# Patient Record
Sex: Female | Born: 1955 | Race: Black or African American | Hispanic: No | Marital: Married | Smoking: Never smoker
Health system: Southern US, Community
[De-identification: ages and names within clinical notes are randomized; demographics above are authoritative.]

## PROBLEM LIST (undated history)

## (undated) DIAGNOSIS — I10 Essential (primary) hypertension: Secondary | ICD-10-CM

## (undated) DIAGNOSIS — M1990 Unspecified osteoarthritis, unspecified site: Secondary | ICD-10-CM

## (undated) DIAGNOSIS — N189 Chronic kidney disease, unspecified: Secondary | ICD-10-CM

## (undated) HISTORY — DX: Unspecified osteoarthritis, unspecified site: M19.90

## (undated) HISTORY — PX: CHOLECYSTECTOMY: SHX55

## (undated) HISTORY — DX: Essential (primary) hypertension: I10

## (undated) HISTORY — PX: TUBAL LIGATION: SHX77

---

## 2015-07-07 ENCOUNTER — Encounter (HOSPITAL_BASED_OUTPATIENT_CLINIC_OR_DEPARTMENT_OTHER): Payer: BLUE CROSS/BLUE SHIELD | Attending: Surgery

## 2015-07-07 DIAGNOSIS — I739 Peripheral vascular disease, unspecified: Secondary | ICD-10-CM | POA: Insufficient documentation

## 2015-07-07 DIAGNOSIS — I10 Essential (primary) hypertension: Secondary | ICD-10-CM | POA: Diagnosis not present

## 2015-07-07 DIAGNOSIS — Z87891 Personal history of nicotine dependence: Secondary | ICD-10-CM | POA: Insufficient documentation

## 2015-07-07 DIAGNOSIS — I87332 Chronic venous hypertension (idiopathic) with ulcer and inflammation of left lower extremity: Secondary | ICD-10-CM | POA: Diagnosis not present

## 2015-07-07 DIAGNOSIS — L97821 Non-pressure chronic ulcer of other part of left lower leg limited to breakdown of skin: Secondary | ICD-10-CM | POA: Diagnosis present

## 2015-07-14 DIAGNOSIS — I87332 Chronic venous hypertension (idiopathic) with ulcer and inflammation of left lower extremity: Secondary | ICD-10-CM | POA: Diagnosis not present

## 2015-07-21 DIAGNOSIS — I87332 Chronic venous hypertension (idiopathic) with ulcer and inflammation of left lower extremity: Secondary | ICD-10-CM | POA: Diagnosis not present

## 2015-07-28 ENCOUNTER — Encounter (HOSPITAL_BASED_OUTPATIENT_CLINIC_OR_DEPARTMENT_OTHER): Payer: BLUE CROSS/BLUE SHIELD | Attending: Surgery

## 2015-07-28 DIAGNOSIS — I739 Peripheral vascular disease, unspecified: Secondary | ICD-10-CM | POA: Diagnosis not present

## 2015-07-28 DIAGNOSIS — I87332 Chronic venous hypertension (idiopathic) with ulcer and inflammation of left lower extremity: Secondary | ICD-10-CM | POA: Insufficient documentation

## 2015-07-28 DIAGNOSIS — L97821 Non-pressure chronic ulcer of other part of left lower leg limited to breakdown of skin: Secondary | ICD-10-CM | POA: Insufficient documentation

## 2015-08-04 ENCOUNTER — Encounter (HOSPITAL_BASED_OUTPATIENT_CLINIC_OR_DEPARTMENT_OTHER): Payer: BLUE CROSS/BLUE SHIELD

## 2015-08-04 DIAGNOSIS — I87332 Chronic venous hypertension (idiopathic) with ulcer and inflammation of left lower extremity: Secondary | ICD-10-CM | POA: Diagnosis not present

## 2015-08-11 DIAGNOSIS — I87332 Chronic venous hypertension (idiopathic) with ulcer and inflammation of left lower extremity: Secondary | ICD-10-CM | POA: Diagnosis not present

## 2015-08-18 DIAGNOSIS — I87332 Chronic venous hypertension (idiopathic) with ulcer and inflammation of left lower extremity: Secondary | ICD-10-CM | POA: Diagnosis not present

## 2015-08-25 ENCOUNTER — Encounter (HOSPITAL_BASED_OUTPATIENT_CLINIC_OR_DEPARTMENT_OTHER): Payer: BLUE CROSS/BLUE SHIELD | Attending: Surgery

## 2015-08-25 DIAGNOSIS — L97821 Non-pressure chronic ulcer of other part of left lower leg limited to breakdown of skin: Secondary | ICD-10-CM | POA: Insufficient documentation

## 2015-08-25 DIAGNOSIS — I87332 Chronic venous hypertension (idiopathic) with ulcer and inflammation of left lower extremity: Secondary | ICD-10-CM | POA: Diagnosis not present

## 2015-08-25 DIAGNOSIS — I10 Essential (primary) hypertension: Secondary | ICD-10-CM | POA: Insufficient documentation

## 2015-09-01 DIAGNOSIS — L97821 Non-pressure chronic ulcer of other part of left lower leg limited to breakdown of skin: Secondary | ICD-10-CM | POA: Diagnosis not present

## 2015-09-08 DIAGNOSIS — L97821 Non-pressure chronic ulcer of other part of left lower leg limited to breakdown of skin: Secondary | ICD-10-CM | POA: Diagnosis not present

## 2015-09-13 DIAGNOSIS — L97821 Non-pressure chronic ulcer of other part of left lower leg limited to breakdown of skin: Secondary | ICD-10-CM | POA: Diagnosis not present

## 2015-09-15 DIAGNOSIS — L97821 Non-pressure chronic ulcer of other part of left lower leg limited to breakdown of skin: Secondary | ICD-10-CM | POA: Diagnosis not present

## 2015-09-22 DIAGNOSIS — L97821 Non-pressure chronic ulcer of other part of left lower leg limited to breakdown of skin: Secondary | ICD-10-CM | POA: Diagnosis not present

## 2015-09-29 ENCOUNTER — Encounter (HOSPITAL_BASED_OUTPATIENT_CLINIC_OR_DEPARTMENT_OTHER): Payer: BLUE CROSS/BLUE SHIELD | Attending: Surgery

## 2015-09-29 DIAGNOSIS — I739 Peripheral vascular disease, unspecified: Secondary | ICD-10-CM | POA: Diagnosis not present

## 2015-09-29 DIAGNOSIS — I10 Essential (primary) hypertension: Secondary | ICD-10-CM | POA: Insufficient documentation

## 2015-09-29 DIAGNOSIS — L97821 Non-pressure chronic ulcer of other part of left lower leg limited to breakdown of skin: Secondary | ICD-10-CM | POA: Insufficient documentation

## 2015-09-29 DIAGNOSIS — Z87891 Personal history of nicotine dependence: Secondary | ICD-10-CM | POA: Diagnosis not present

## 2015-09-29 DIAGNOSIS — I87332 Chronic venous hypertension (idiopathic) with ulcer and inflammation of left lower extremity: Secondary | ICD-10-CM | POA: Insufficient documentation

## 2015-10-05 DIAGNOSIS — I87332 Chronic venous hypertension (idiopathic) with ulcer and inflammation of left lower extremity: Secondary | ICD-10-CM | POA: Diagnosis not present

## 2015-10-12 DIAGNOSIS — I87332 Chronic venous hypertension (idiopathic) with ulcer and inflammation of left lower extremity: Secondary | ICD-10-CM | POA: Diagnosis not present

## 2015-10-19 DIAGNOSIS — I87332 Chronic venous hypertension (idiopathic) with ulcer and inflammation of left lower extremity: Secondary | ICD-10-CM | POA: Diagnosis not present

## 2015-10-26 ENCOUNTER — Encounter (HOSPITAL_BASED_OUTPATIENT_CLINIC_OR_DEPARTMENT_OTHER): Payer: BLUE CROSS/BLUE SHIELD | Attending: Surgery

## 2015-10-26 DIAGNOSIS — L97821 Non-pressure chronic ulcer of other part of left lower leg limited to breakdown of skin: Secondary | ICD-10-CM | POA: Diagnosis not present

## 2015-10-26 DIAGNOSIS — Z872 Personal history of diseases of the skin and subcutaneous tissue: Secondary | ICD-10-CM | POA: Insufficient documentation

## 2015-10-26 DIAGNOSIS — I10 Essential (primary) hypertension: Secondary | ICD-10-CM | POA: Diagnosis not present

## 2015-11-02 DIAGNOSIS — L97821 Non-pressure chronic ulcer of other part of left lower leg limited to breakdown of skin: Secondary | ICD-10-CM | POA: Diagnosis not present

## 2015-12-22 ENCOUNTER — Encounter (HOSPITAL_BASED_OUTPATIENT_CLINIC_OR_DEPARTMENT_OTHER): Payer: BLUE CROSS/BLUE SHIELD | Attending: Surgery

## 2015-12-22 DIAGNOSIS — I890 Lymphedema, not elsewhere classified: Secondary | ICD-10-CM | POA: Insufficient documentation

## 2015-12-22 DIAGNOSIS — L97821 Non-pressure chronic ulcer of other part of left lower leg limited to breakdown of skin: Secondary | ICD-10-CM | POA: Insufficient documentation

## 2015-12-22 DIAGNOSIS — M1990 Unspecified osteoarthritis, unspecified site: Secondary | ICD-10-CM | POA: Diagnosis not present

## 2015-12-22 DIAGNOSIS — I10 Essential (primary) hypertension: Secondary | ICD-10-CM | POA: Insufficient documentation

## 2015-12-22 DIAGNOSIS — I87332 Chronic venous hypertension (idiopathic) with ulcer and inflammation of left lower extremity: Secondary | ICD-10-CM | POA: Insufficient documentation

## 2015-12-29 ENCOUNTER — Encounter (HOSPITAL_BASED_OUTPATIENT_CLINIC_OR_DEPARTMENT_OTHER): Payer: BLUE CROSS/BLUE SHIELD | Attending: Surgery

## 2015-12-29 DIAGNOSIS — I890 Lymphedema, not elsewhere classified: Secondary | ICD-10-CM | POA: Diagnosis not present

## 2015-12-29 DIAGNOSIS — Z87891 Personal history of nicotine dependence: Secondary | ICD-10-CM | POA: Diagnosis not present

## 2015-12-29 DIAGNOSIS — I10 Essential (primary) hypertension: Secondary | ICD-10-CM | POA: Insufficient documentation

## 2015-12-29 DIAGNOSIS — I87332 Chronic venous hypertension (idiopathic) with ulcer and inflammation of left lower extremity: Secondary | ICD-10-CM | POA: Insufficient documentation

## 2015-12-29 DIAGNOSIS — L97821 Non-pressure chronic ulcer of other part of left lower leg limited to breakdown of skin: Secondary | ICD-10-CM | POA: Insufficient documentation

## 2016-01-04 ENCOUNTER — Other Ambulatory Visit: Payer: Self-pay | Admitting: Surgery

## 2016-01-04 ENCOUNTER — Ambulatory Visit (HOSPITAL_COMMUNITY)
Admission: RE | Admit: 2016-01-04 | Discharge: 2016-01-04 | Disposition: A | Discharge status: Home / Self Care | Payer: BLUE CROSS/BLUE SHIELD | Source: Ambulatory Visit | Attending: Vascular Surgery | Admitting: Vascular Surgery

## 2016-01-04 ENCOUNTER — Encounter (HOSPITAL_BASED_OUTPATIENT_CLINIC_OR_DEPARTMENT_OTHER): Payer: BLUE CROSS/BLUE SHIELD

## 2016-01-04 DIAGNOSIS — I8392 Asymptomatic varicose veins of left lower extremity: Secondary | ICD-10-CM | POA: Insufficient documentation

## 2016-01-04 DIAGNOSIS — R600 Localized edema: Secondary | ICD-10-CM

## 2016-01-04 DIAGNOSIS — I87332 Chronic venous hypertension (idiopathic) with ulcer and inflammation of left lower extremity: Secondary | ICD-10-CM | POA: Diagnosis not present

## 2016-01-04 DIAGNOSIS — L97829 Non-pressure chronic ulcer of other part of left lower leg with unspecified severity: Secondary | ICD-10-CM | POA: Diagnosis not present

## 2016-01-04 DIAGNOSIS — L97929 Non-pressure chronic ulcer of unspecified part of left lower leg with unspecified severity: Secondary | ICD-10-CM

## 2016-01-11 DIAGNOSIS — I87332 Chronic venous hypertension (idiopathic) with ulcer and inflammation of left lower extremity: Secondary | ICD-10-CM | POA: Diagnosis not present

## 2016-01-18 DIAGNOSIS — I87332 Chronic venous hypertension (idiopathic) with ulcer and inflammation of left lower extremity: Secondary | ICD-10-CM | POA: Diagnosis not present

## 2016-01-25 ENCOUNTER — Encounter (HOSPITAL_BASED_OUTPATIENT_CLINIC_OR_DEPARTMENT_OTHER): Payer: BLUE CROSS/BLUE SHIELD | Attending: Surgery

## 2016-01-25 DIAGNOSIS — S81012A Laceration without foreign body, left knee, initial encounter: Secondary | ICD-10-CM | POA: Diagnosis not present

## 2016-01-25 DIAGNOSIS — M1990 Unspecified osteoarthritis, unspecified site: Secondary | ICD-10-CM | POA: Insufficient documentation

## 2016-01-25 DIAGNOSIS — L97821 Non-pressure chronic ulcer of other part of left lower leg limited to breakdown of skin: Secondary | ICD-10-CM | POA: Insufficient documentation

## 2016-01-25 DIAGNOSIS — I10 Essential (primary) hypertension: Secondary | ICD-10-CM | POA: Diagnosis not present

## 2016-01-25 DIAGNOSIS — I87332 Chronic venous hypertension (idiopathic) with ulcer and inflammation of left lower extremity: Secondary | ICD-10-CM | POA: Insufficient documentation

## 2016-01-25 DIAGNOSIS — I890 Lymphedema, not elsewhere classified: Secondary | ICD-10-CM | POA: Insufficient documentation

## 2016-01-25 DIAGNOSIS — W19XXXA Unspecified fall, initial encounter: Secondary | ICD-10-CM | POA: Insufficient documentation

## 2016-02-01 DIAGNOSIS — I87332 Chronic venous hypertension (idiopathic) with ulcer and inflammation of left lower extremity: Secondary | ICD-10-CM | POA: Diagnosis not present

## 2016-02-08 DIAGNOSIS — I87332 Chronic venous hypertension (idiopathic) with ulcer and inflammation of left lower extremity: Secondary | ICD-10-CM | POA: Diagnosis not present

## 2016-02-16 DIAGNOSIS — I87332 Chronic venous hypertension (idiopathic) with ulcer and inflammation of left lower extremity: Secondary | ICD-10-CM | POA: Diagnosis not present

## 2016-02-23 DIAGNOSIS — I87332 Chronic venous hypertension (idiopathic) with ulcer and inflammation of left lower extremity: Secondary | ICD-10-CM | POA: Diagnosis not present

## 2017-04-11 ENCOUNTER — Ambulatory Visit: Payer: BLUE CROSS/BLUE SHIELD | Admitting: Orthopaedic Surgery

## 2017-04-17 ENCOUNTER — Ambulatory Visit: Payer: Medicare Other | Admitting: Orthopaedic Surgery

## 2017-04-25 ENCOUNTER — Ambulatory Visit (INDEPENDENT_AMBULATORY_CARE_PROVIDER_SITE_OTHER): Payer: Medicare Other | Admitting: Orthopaedic Surgery

## 2017-04-25 ENCOUNTER — Encounter: Payer: Self-pay | Admitting: Orthopaedic Surgery

## 2017-04-25 VITALS — BP 172/80 | HR 75 | Temp 97.9°F | Ht 63.0 in | Wt 275.0 lb

## 2017-04-25 DIAGNOSIS — M25561 Pain in right knee: Secondary | ICD-10-CM | POA: Diagnosis not present

## 2017-04-25 DIAGNOSIS — G8929 Other chronic pain: Secondary | ICD-10-CM

## 2017-04-25 MED ORDER — NAPROXEN 500 MG PO TABS: 500.0000 mg | ORAL_TABLET | Freq: Two times a day (BID) | ORAL | 5 refills | Status: DC

## 2017-04-25 NOTE — Progress Notes (Signed)
Subjective:    Patient ID: Stacy [name], female    DOB: [birth year], 61 y.o.   MRN: [id number]  HPI She has long history of right knee pain.  She has been seen in [place] by physician there and has decided to be seen here now. She has popping and swelling of the right knee with some giving way.  She brings CD of X-rays of the right knee done within the past year.  She has no new trauma. She has been taking Naprosyn but ran out of it this past week.  She uses ice and Tylenol at times.  Her knee is getting gradually worse.  I have reviewed the CD of the X-rays and her medical notes from [facility].  Review of Systems  HENT: Negative for congestion.   Respiratory: Negative for cough and shortness of breath.   Cardiovascular: Negative for chest pain and leg swelling.  Endocrine: Positive for cold intolerance.  Musculoskeletal: Positive for arthralgias, back pain, gait problem and joint swelling.  Allergic/Immunologic: Positive for environmental allergies.   Past Medical History:  Diagnosis Date  . Arthritis   . Hypertension     Past Surgical History:  Procedure Laterality Date  . TUBAL LIGATION      No current outpatient prescriptions on file prior to visit.   No current facility-administered medications on file prior to visit.     Social History   Social History  . Marital status: Married    Spouse name: N/A  . Number of children: N/A  . Years of education: N/A   Occupational History  . Not on file.   Social History Main Topics  . Smoking status: Never Smoker  . Smokeless tobacco: Never Used  . Alcohol use Not on file  . Drug use: Unknown  . Sexual activity: Not on file   Other Topics Concern  . Not on file   Social History Narrative  . No narrative on file    Family History  Problem Relation Age of Onset  . COPD Mother   . Arthritis Mother   . Heart attack Mother   . Cancer Father   . Hypertension Father   . Arthritis Sister   . Hypertension  Sister   . Arthritis Brother   . Hypertension Brother     BP (!) 172/80   Pulse 75   Temp 97.9 F (36.6 C)   Ht 5\' 3"  (1.6 m)   Wt 275 lb (124.7 kg)   BMI 48.71 kg/m      Objective:   Physical Exam  Constitutional: She is oriented to person, place, and time. She appears well-developed and well-nourished.  HENT:  Head: Normocephalic and atraumatic.  Eyes: Pupils are equal, round, and reactive to light. Conjunctivae and EOM are normal.  Neck: Normal range of motion. Neck supple.  Cardiovascular: Normal rate, regular rhythm and intact distal pulses.   Pulmonary/Chest: Effort normal.  Abdominal: Soft.  Musculoskeletal: She exhibits tenderness (Right knee pain, ROM 0 to 105, crepitus, medial joint line pain, Limp to the right, NV intact.  Knee is stable.).  Neurological: She is alert and oriented to person, place, and time. She displays normal reflexes. No cranial nerve deficit. She exhibits normal muscle tone. Coordination normal.  Skin: Skin is warm and dry.  Psychiatric: She has a normal mood and affect. Her behavior is normal. Judgment and thought content normal.          Assessment & Plan:   Encounter Diagnosis  Name Primary?  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  She has morbid obesity and that would help to lose some weight.  She does not want to consider any surgery at this time.  PROCEDURE NOTE:  The patient requests injections of the right knee , verbal consent was obtained.  The right knee was prepped appropriately after time out was performed.   Sterile technique was observed and injection of 1 cc of Depo-Medrol 40 mg with several cc's of plain xylocaine. Anesthesia was provided by ethyl chloride and a 20-gauge needle was used to inject the knee area. The injection was tolerated well.  A band aid dressing was  applied.  The patient was advised to apply ice later today and tomorrow to the injection sight as needed.  I will see her back in three weeks.  Rx for Naprosyn called in.  Call if any problem.  Precautions discussed.   Electronically Signed Darreld McleanWayne Senetra Dillin, MD 10/31/20182:04 PM

## 2017-05-02 ENCOUNTER — Encounter: Payer: Self-pay | Admitting: Orthopedic Surgery

## 2017-05-16 ENCOUNTER — Encounter: Payer: Self-pay | Admitting: Orthopaedic Surgery

## 2017-05-16 ENCOUNTER — Ambulatory Visit (INDEPENDENT_AMBULATORY_CARE_PROVIDER_SITE_OTHER): Payer: Medicare Other | Admitting: Orthopaedic Surgery

## 2017-05-16 VITALS — BP 173/91 | HR 67 | Temp 97.8°F | Ht 63.0 in | Wt 278.0 lb

## 2017-05-16 DIAGNOSIS — G8929 Other chronic pain: Secondary | ICD-10-CM

## 2017-05-16 DIAGNOSIS — M25561 Pain in right knee: Secondary | ICD-10-CM | POA: Diagnosis not present

## 2017-05-16 NOTE — Progress Notes (Signed)
Patient ZO:XWRUEA:Stacy [name], female DOB:06-Dec-1955, 61 y.o. VWU:[id number]  Chief Complaint  Patient presents with  . Knee Pain    right and new problem with left hip    HPI  Stacy [name] is a 61 y.o. female who has chronic pain of the right knee.  It is better. The injection to the knee last time helped.  She still has some swelling and popping but the pain is less.  She has no giving way or locking.  The Naprosyn helps as well.  She has no new trauma. HPI  Body mass index is 49.25 kg/m.  ROS  Review of Systems  HENT: Negative for congestion.   Respiratory: Negative for cough and shortness of breath.   Cardiovascular: Negative for chest pain and leg swelling.  Endocrine: Positive for cold intolerance.  Musculoskeletal: Positive for arthralgias, back pain, gait problem and joint swelling.  Allergic/Immunologic: Positive for environmental allergies.    Past Medical History:  Diagnosis Date  . Arthritis   . Hypertension     Past Surgical History:  Procedure Laterality Date  . TUBAL LIGATION      Family History  Problem Relation Age of Onset  . COPD Mother   . Arthritis Mother   . Heart attack Mother   . Cancer Father   . Hypertension Father   . Arthritis Sister   . Hypertension Sister   . Arthritis Brother   . Hypertension Brother     Social History Social History   Tobacco Use  . Smoking status: Never Smoker  . Smokeless tobacco: Never Used  Substance Use Topics  . Alcohol use: Not on file  . Drug use: Not on file    No Known Allergies  Current Outpatient Medications  Medication Sig Dispense Refill  . naproxen (NAPROSYN) 500 MG tablet Take 1 tablet (500 mg total) by mouth 2 (two) times daily with a meal. 60 tablet 5   No current facility-administered medications for this visit.      Physical Exam  Blood pressure (!) 173/91, pulse 67, temperature 97.8 F (36.6 C), height 5\' 3"  (1.6 m), weight 278 lb (126.1 kg).  Constitutional: overall normal  hygiene, normal nutrition, well developed, normal grooming, normal body habitus. Assistive device:none  Musculoskeletal: gait and station Limp right, muscle tone and strength are normal, no tremors or atrophy is present.  .  Neurological: coordination overall normal.  Deep tendon reflex/nerve stretch intact.  Sensation normal.  Cranial nerves II-XII intact.   Skin:   Normal overall no scars, lesions, ulcers or rashes. No psoriasis.  Psychiatric: Alert and oriented x 3.  Recent memory intact, remote memory unclear.  Normal mood and affect. Well groomed.  Good eye contact.  Cardiovascular: overall no swelling, no varicosities, no edema bilaterally, normal temperatures of the legs and arms, no clubbing, cyanosis and good capillary refill.  Lymphatic: palpation is normal.  All other systems reviewed and are negative   The right lower extremity is examined:  Inspection:  Thigh:  Non-tender and no defects  Knee has swelling 1+ effusion.                        Joint tenderness is present                        Patient is tender over the medial joint line  Lower Leg:  Has normal appearance and no tenderness or defects  Ankle:  Non-tender and no defects  Foot:  Non-tender and no defects Range of Motion:  Knee:  Range of motion is: 0-105                        Crepitus is  present  Ankle:  Range of motion is normal. Strength and Tone:  The right lower extremity has normal strength and tone. Stability:  Knee:  The knee is stable.  Ankle:  The ankle is stable.   The patient has been educated about the nature of the problem(s) and counseled on treatment options.  The patient appeared to understand what I have discussed and is in agreement with it.  Encounter Diagnosis  Name Primary?  . Chronic pain of right knee Yes    PLAN Call if any problems.  Precautions discussed.  Continue current medications.   Return to clinic prn   Electronically Signed Darreld McleanWayne Tamakia Porto,  MD 11/21/20189:23 AM

## 2018-03-09 DIAGNOSIS — N179 Acute kidney failure, unspecified: Secondary | ICD-10-CM | POA: Insufficient documentation

## 2018-03-09 DIAGNOSIS — I10 Essential (primary) hypertension: Secondary | ICD-10-CM | POA: Insufficient documentation

## 2018-03-09 DIAGNOSIS — Z9049 Acquired absence of other specified parts of digestive tract: Secondary | ICD-10-CM | POA: Insufficient documentation

## 2018-03-09 DIAGNOSIS — K838 Other specified diseases of biliary tract: Secondary | ICD-10-CM | POA: Insufficient documentation

## 2018-03-09 DIAGNOSIS — K9189 Other postprocedural complications and disorders of digestive system: Secondary | ICD-10-CM | POA: Insufficient documentation

## 2018-03-13 DIAGNOSIS — Z6841 Body Mass Index (BMI) 40.0 and over, adult: Secondary | ICD-10-CM | POA: Insufficient documentation

## 2018-04-10 ENCOUNTER — Ambulatory Visit (INDEPENDENT_AMBULATORY_CARE_PROVIDER_SITE_OTHER): Payer: Medicare PPO | Admitting: Orthopaedic Surgery

## 2018-04-10 ENCOUNTER — Encounter: Payer: Self-pay | Admitting: Orthopaedic Surgery

## 2018-04-10 VITALS — BP 162/83 | HR 98 | Ht 63.0 in | Wt 237.0 lb

## 2018-04-10 DIAGNOSIS — Z6841 Body Mass Index (BMI) 40.0 and over, adult: Secondary | ICD-10-CM | POA: Diagnosis not present

## 2018-04-10 DIAGNOSIS — M7062 Trochanteric bursitis, left hip: Secondary | ICD-10-CM | POA: Diagnosis not present

## 2018-04-10 DIAGNOSIS — M25561 Pain in right knee: Secondary | ICD-10-CM

## 2018-04-10 DIAGNOSIS — G8929 Other chronic pain: Secondary | ICD-10-CM

## 2018-04-10 NOTE — Progress Notes (Signed)
Patient Stacy [name], female DOB:1956-04-02, 62 y.o. VWU:[id number]  Chief Complaint  Patient presents with  . Back Pain    Left side lower back and hip pain    HPI  Stacy [name] is a 63 y.o. female who has pain of the right knee chronically.  I saw her for this a year ago.  She has had more pain recently.  She has swelling and popping but no trauma, no giving way.  She has developed pain of the left hip laterally.  It hurts to stand at times.  She has pain rolling over on it at night.  She has swelling, no redness, no trauma.  She had a gall bladder surgery done in [place] several weeks ago and has a drain tube.  She was seen at [facility] yesterday and they will schedule some surgery for this in the near future.   Body mass index is 41.98 kg/m.  The patient meets the AMA guidelines for Morbid (severe) obesity with a BMI > 40.0 and I have recommended weight loss.   ROS  Review of Systems  All other systems reviewed and are negative.  The following is a summary of the past history medically, past history surgically, known current medicines, social history and family history.  This information is gathered electronically by the computer from prior information and documentation.  I review this each visit and have found including this information at this point in the chart is beneficial and informative.    Past Medical History:  Diagnosis Date  . Arthritis   . Hypertension     Past Surgical History:  Procedure Laterality Date  . TUBAL LIGATION      Family History  Problem Relation Age of Onset  . COPD Mother   . Arthritis Mother   . Heart attack Mother   . Cancer Father   . Hypertension Father   . Arthritis Sister   . Hypertension Sister   . Arthritis Brother   . Hypertension Brother     Social History Social History   Tobacco Use  . Smoking status: Never Smoker  . Smokeless tobacco: Never Used  Substance Use Topics  . Alcohol use: Not on file  . Drug use:  Not on file    No Known Allergies  Current Outpatient Medications  Medication Sig Dispense Refill  . naproxen (NAPROSYN) 500 MG tablet Take 1 tablet (500 mg total) by mouth 2 (two) times daily with a meal. 60 tablet 5   No current facility-administered medications for this visit.      Physical Exam  Blood pressure (!) 162/83, pulse 98, height 5\' 3"  (1.6 m), weight 237 lb (107.5 kg).  Constitutional: overall normal hygiene, normal nutrition, well developed, normal grooming, normal body habitus. Assistive device:none  Musculoskeletal: gait and station Limp left, muscle tone and strength are normal, no tremors or atrophy is present.  .  Neurological: coordination overall normal.  Deep tendon reflex/nerve stretch intact.  Sensation normal.  Cranial nerves II-XII intact.   Skin:   Normal overall no scars, lesions, ulcers or rashes. No psoriasis.  Psychiatric: Alert and oriented x 3.  Recent memory intact, remote memory unclear.  Normal mood and affect. Well groomed.  Good eye contact.  Cardiovascular: overall no swelling, no varicosities, no edema bilaterally, normal temperatures of the legs and arms, no clubbing, cyanosis and good capillary refill.  Lymphatic: palpation is normal.  Left hip tender over trochanteric area.  She has no redness or swelling.  ROM of the  hip is full.  Right knee is tender.  ROM 0 to 105 with crepitus, slight effusion, medial joint line pain.  All other systems reviewed and are negative   The patient has been educated about the nature of the problem(s) and counseled on treatment options.  The patient appeared to understand what I have discussed and is in agreement with it.  Encounter Diagnoses  Name Primary?  . Trochanteric bursitis, left hip Yes  . Chronic pain of right knee   . Body mass index 40.0-44.9, adult (HCC)   . Morbid obesity (HCC)    PROCEDURE NOTE:  The patient request injection, verbal consent was obtained.  The left trochanteric  area of the hip was prepped appropriately after time out was performed.   Sterile technique was observed and injection of 1 cc of Depo-Medrol 40 mg with several cc's of plain xylocaine. Anesthesia was provided by ethyl chloride and a 20-gauge needle was used to inject the hip area. The injection was tolerated well.  A band aid dressing was applied.  The patient was advised to apply ice later today and tomorrow to the injection sight as needed.  PROCEDURE NOTE:  The patient requests injections of the right knee , verbal consent was obtained.  The right knee was prepped appropriately after time out was performed.   Sterile technique was observed and injection of 1 cc of Depo-Medrol 40 mg with several cc's of plain xylocaine. Anesthesia was provided by ethyl chloride and a 20-gauge needle was used to inject the knee area. The injection was tolerated well.  A band aid dressing was applied.  The patient was advised to apply ice later today and tomorrow to the injection sight as needed.  PLAN Call if any problems.  Precautions discussed.  Continue current medications.   Return to clinic 6 weeks   Electronically Signed Darreld Mclean, MD 10/16/20199:23 AM

## 2018-05-28 ENCOUNTER — Ambulatory Visit: Payer: Medicare PPO | Admitting: Orthopaedic Surgery

## 2018-05-28 ENCOUNTER — Encounter: Payer: Self-pay | Admitting: Orthopaedic Surgery

## 2018-05-28 VITALS — BP 163/87 | HR 73 | Ht 63.0 in | Wt 236.0 lb

## 2018-05-28 DIAGNOSIS — M25561 Pain in right knee: Secondary | ICD-10-CM

## 2018-05-28 DIAGNOSIS — M545 Low back pain, unspecified: Secondary | ICD-10-CM

## 2018-05-28 DIAGNOSIS — M7062 Trochanteric bursitis, left hip: Secondary | ICD-10-CM

## 2018-05-28 DIAGNOSIS — G8929 Other chronic pain: Secondary | ICD-10-CM | POA: Diagnosis not present

## 2018-05-28 MED ORDER — HYDROCODONE-ACETAMINOPHEN 5-325 MG PO TABS: ORAL_TABLET | ORAL | 0 refills | Status: DC

## 2018-05-28 MED ORDER — DICLOFENAC SODIUM 75 MG PO TBEC: 75.0000 mg | DELAYED_RELEASE_TABLET | Freq: Two times a day (BID) | ORAL | 2 refills | Status: DC

## 2018-05-28 NOTE — Progress Notes (Signed)
Patient Stacy [name]:[name], female DOB:1956/05/27, 62 y.o. VWU:[id number]  Chief Complaint  Patient presents with  . Knee Pain    Right knee  . Hip Pain    Left hip     HPI  Stacy [name] is a 62 y.o. female who has right knee pain, lower back pain and left hip pain.  Her right knee is more tender today.  She has swelling and popping but no giving way.  She has no redness.  The lower back is tender in the mid portion with no radiation of pain.  It comes and goes.  It is worse with cold weather and rain.  She has no trauma.  Her left hip is tender over the trochanteric area only slightly today.     Body mass index is 41.81 kg/m.  The patient meets the AMA guidelines for Morbid (severe) obesity with a BMI > 40.0 and I have recommended weight loss.   ROS  Review of Systems  HENT: Negative for congestion.   Respiratory: Negative for cough and shortness of breath.   Cardiovascular: Negative for chest pain and leg swelling.  Endocrine: Positive for cold intolerance.  Musculoskeletal: Positive for arthralgias, back pain, gait problem and joint swelling.  Allergic/Immunologic: Positive for environmental allergies.    All other systems reviewed and are negative.  The following is a summary of the past history medically, past history surgically, known current medicines, social history and family history.  This information is gathered electronically by the computer from prior information and documentation.  I review this each visit and have found including this information at this point in the chart is beneficial and informative.    Past Medical History:  Diagnosis Date  . Arthritis   . Hypertension     Past Surgical History:  Procedure Laterality Date  . TUBAL LIGATION      Family History  Problem Relation Age of Onset  . COPD Mother   . Arthritis Mother   . Heart attack Mother   . Cancer Father   . Hypertension Father   . Arthritis Sister   . Hypertension Sister   .  Arthritis Brother   . Hypertension Brother     Social History Social History   Tobacco Use  . Smoking status: Never Smoker  . Smokeless tobacco: Never Used  Substance Use Topics  . Alcohol use: Not on file  . Drug use: Not on file    No Known Allergies  Current Outpatient Medications  Medication Sig Dispense Refill  . diclofenac (VOLTAREN) 75 MG EC tablet Take 1 tablet (75 mg total) by mouth 2 (two) times daily with a meal. 60 tablet 2  . HYDROcodone-acetaminophen (NORCO/VICODIN) 5-325 MG tablet One tablet every four hours as needed for acute pain.  Limit of five days per [place]. 30 tablet 0   No current facility-administered medications for this visit.      Physical Exam  Blood pressure (!) 163/87, pulse 73, height 5\' 3"  (1.6 m), weight 236 lb (107 kg).  Constitutional: overall normal hygiene, normal nutrition, well developed, normal grooming, normal body habitus. Assistive device:none  Musculoskeletal: gait and station Limp right, muscle tone and strength are normal, no tremors or atrophy is present.  .  Neurological: coordination overall normal.  Deep tendon reflex/nerve stretch intact.  Sensation normal.  Cranial nerves II-XII intact.   Skin:   Normal overall no scars, lesions, ulcers or rashes. No psoriasis.  Psychiatric: Alert and oriented x 3.  Recent memory  intact, remote memory unclear.  Normal mood and affect. Well groomed.  Good eye contact.  Cardiovascular: overall no swelling, no varicosities, no edema bilaterally, normal temperatures of the legs and arms, no clubbing, cyanosis and good capillary refill.  Lymphatic: palpation is normal.  Right knee has tenderness, ROM 0 to 110, medial joint pain, slight effusion, limp to the right, stable.  Spine/Pelvis examination:  Inspection:  Overall, sacoiliac joint benign and hips nontender; without crepitus or defects.   Thoracic spine inspection: Alignment normal without kyphosis present   Lumbar spine  inspection:  Alignment  with normal lumbar lordosis, without scoliosis apparent.   Thoracic spine palpation:  without tenderness of spinal processes   Lumbar spine palpation: without tenderness of lumbar area; without tightness of lumbar muscles    Range of Motion:   Lumbar flexion, forward flexion is normal without pain or tenderness    Lumbar extension is full without pain or tenderness   Left lateral bend is normal without pain or tenderness   Right lateral bend is normal without pain or tenderness   Straight leg raising is normal  Strength & tone: normal   Stability overall normal stability  The left hip has just slight tenderness over the lateral trochanteric area.  All other systems reviewed and are negative   The patient has been educated about the nature of the problem(s) and counseled on treatment options.  The patient appeared to understand what I have discussed and is in agreement with it.  Encounter Diagnoses  Name Primary?  . Chronic pain of right knee Yes  . Chronic midline low back pain without sciatica   . Trochanteric bursitis, left hip     PLAN Call if any problems.  Precautions discussed. I will change to diclofenac.  PROCEDURE NOTE:  The patient requests injections of the right knee , verbal consent was obtained.  The right knee was prepped appropriately after time out was performed.   Sterile technique was observed and injection of 1 cc of Depo-Medrol 40 mg with several cc's of plain xylocaine. Anesthesia was provided by ethyl chloride and a 20-gauge needle was used to inject the knee area. The injection was tolerated well.  A band aid dressing was applied.  The patient was advised to apply ice later today and tomorrow to the injection sight as needed.  Return to clinic 2 months   I have reviewed the [facility] Controlled Substance Reporting System web site prior to prescribing narcotic medicine for this patient.  Electronically Signed Darreld Mclean, MD 12/3/20191:45 PM

## 2018-07-30 ENCOUNTER — Ambulatory Visit: Payer: Medicare PPO | Admitting: Orthopaedic Surgery

## 2018-08-20 ENCOUNTER — Ambulatory Visit: Payer: Medicare PPO | Admitting: Orthopaedic Surgery

## 2018-08-27 ENCOUNTER — Encounter: Payer: Self-pay | Admitting: Orthopaedic Surgery

## 2018-08-27 ENCOUNTER — Ambulatory Visit: Payer: Medicare PPO | Admitting: Orthopaedic Surgery

## 2018-08-27 VITALS — BP 163/100 | HR 66 | Ht 63.0 in | Wt 244.0 lb

## 2018-08-27 DIAGNOSIS — M25561 Pain in right knee: Secondary | ICD-10-CM

## 2018-08-27 DIAGNOSIS — M545 Low back pain, unspecified: Secondary | ICD-10-CM

## 2018-08-27 DIAGNOSIS — Z6841 Body Mass Index (BMI) 40.0 and over, adult: Secondary | ICD-10-CM

## 2018-08-27 DIAGNOSIS — G8929 Other chronic pain: Secondary | ICD-10-CM | POA: Diagnosis not present

## 2018-08-27 MED ORDER — HYDROCODONE-ACETAMINOPHEN 5-325 MG PO TABS: ORAL_TABLET | ORAL | 0 refills | Status: DC

## 2018-08-27 NOTE — Progress Notes (Signed)
Patient Stacy [name], female DOB:December 08, 1955, 63 y.o. [id number]  Chief Complaint  Patient presents with  . Knee Pain    Chronic right knee pain.    HPI  Stacy [name] is a 63 y.o. female who has lower back pain and right knee pain.  Her lower back has been tender over the last few weeks.  She has no numbness.  Her back hurts at the end of the work shift.    She has right knee pain with swelling and pain and popping but no giving way, no redness.  She has tried Advil with no help.   Body mass index is 43.22 kg/m.  The patient meets the AMA guidelines for Morbid (severe) obesity with a BMI > 40.0 and I have recommended weight loss.   ROS  Review of Systems  Constitutional: Positive for activity change.  HENT: Negative for congestion.   Respiratory: Negative for cough and shortness of breath.   Cardiovascular: Negative for chest pain and leg swelling.  Endocrine: Positive for cold intolerance.  Musculoskeletal: Positive for arthralgias, back pain, gait problem and joint swelling.  Allergic/Immunologic: Positive for environmental allergies.  All other systems reviewed and are negative.   All other systems reviewed and are negative.  The following is a summary of the past history medically, past history surgically, known current medicines, social history and family history.  This information is gathered electronically by the computer from prior information and documentation.  I review this each visit and have found including this information at this point in the chart is beneficial and informative.    Past Medical History:  Diagnosis Date  . Arthritis   . Hypertension     Past Surgical History:  Procedure Laterality Date  . TUBAL LIGATION      Family History  Problem Relation Age of Onset  . COPD Mother   . Arthritis Mother   . Heart attack Mother   . Cancer Father   . Hypertension Father   . Arthritis Sister   . Hypertension Sister   . Arthritis Brother    . Hypertension Brother     Social History Social History   Tobacco Use  . Smoking status: Never Smoker  . Smokeless tobacco: Never Used  Substance Use Topics  . Alcohol use: Not on file  . Drug use: Not on file    No Known Allergies  Current Outpatient Medications  Medication Sig Dispense Refill  . diclofenac (VOLTAREN) 75 MG EC tablet Take 1 tablet (75 mg total) by mouth 2 (two) times daily with a meal. 60 tablet 2  . HYDROcodone-acetaminophen (NORCO/VICODIN) 5-325 MG tablet One tablet every four hours as needed for acute pain.  Limit of five days per [place]. 30 tablet 0   No current facility-administered medications for this visit.      Physical Exam  Blood pressure (!) 163/100, pulse 66, height 5\' 3"  (1.6 m), weight 244 lb (110.7 kg).  Constitutional: overall normal hygiene, normal nutrition, well developed, normal grooming, normal body habitus. Assistive device:none  Musculoskeletal: gait and station Limp right, muscle tone and strength are normal, no tremors or atrophy is present.  .  Neurological: coordination overall normal.  Deep tendon reflex/nerve stretch intact.  Sensation normal.  Cranial nerves II-XII intact.   Skin:   Normal overall no scars, lesions, ulcers or rashes. No psoriasis.  Psychiatric: Alert and oriented x 3.  Recent memory intact, remote memory unclear.  Normal mood and affect. Well groomed.  Good eye  contact.  Cardiovascular: overall no swelling, no varicosities, no edema bilaterally, normal temperatures of the legs and arms, no clubbing, cyanosis and good capillary refill.  Lymphatic: palpation is normal.  Spine/Pelvis examination:  Inspection:  Overall, sacoiliac joint benign and hips nontender; without crepitus or defects.   Thoracic spine inspection: Alignment normal without kyphosis present   Lumbar spine inspection:  Alignment  with normal lumbar lordosis, without scoliosis apparent.   Thoracic spine palpation:  without  tenderness of spinal processes   Lumbar spine palpation: without tenderness of lumbar area; without tightness of lumbar muscles    Range of Motion:   Lumbar flexion, forward flexion is normal without pain or tenderness    Lumbar extension is full without pain or tenderness   Left lateral bend is normal without pain or tenderness   Right lateral bend is normal without pain or tenderness   Straight leg raising is normal  Strength & tone: normal   Stability overall normal stability  Her right knee has effusion, crepitus, ROM 0 to 105, limp to the right and medial pain.  All other systems reviewed and are negative   The patient has been educated about the nature of the problem(s) and counseled on treatment options.  The patient appeared to understand what I have discussed and is in agreement with it.  Encounter Diagnoses  Name Primary?  . Chronic pain of right knee Yes  . Chronic midline low back pain without sciatica   . Body mass index 40.0-44.9, adult (HCC)   . Morbid obesity (HCC)    PROCEDURE NOTE:  The patient requests injections of the right knee , verbal consent was obtained.  The right knee was prepped appropriately after time out was performed.   Sterile technique was observed and injection of 1 cc of Depo-Medrol 40 mg with several cc's of plain xylocaine. Anesthesia was provided by ethyl chloride and a 20-gauge needle was used to inject the knee area. The injection was tolerated well.  A band aid dressing was applied.  The patient was advised to apply ice later today and tomorrow to the injection sight as needed.   PLAN Call if any problems.  Precautions discussed.  Continue current medications.   Return to clinic 3 months   I have reviewed the [facility] Controlled Substance Reporting System web site prior to prescribing narcotic medicine for this patient.   Electronically Signed Darreld Mclean, MD 3/3/20203:05 PM

## 2018-09-11 ENCOUNTER — Telehealth: Payer: Self-pay | Admitting: Orthopaedic Surgery

## 2018-09-11 NOTE — Telephone Encounter (Signed)
I called back to patient as per Dr Hilda Lias and offered appointment as in first conversation. States she thinks she will hold off for right now, all things considered.  May still call back for tomorrow morning as discussed.

## 2018-09-11 NOTE — Telephone Encounter (Signed)
Patient called to inquire about possible injection of hip; states having pain again (previously noted: bursitis).  Patient was seen for right knee and had injection 08/27/18.  Please advise.

## 2018-09-11 NOTE — Telephone Encounter (Signed)
I can see for this.  Call her

## 2018-09-22 ENCOUNTER — Other Ambulatory Visit: Payer: Self-pay | Admitting: Orthopaedic Surgery

## 2018-11-27 ENCOUNTER — Ambulatory Visit: Payer: Self-pay | Admitting: Orthopaedic Surgery

## 2018-11-28 ENCOUNTER — Other Ambulatory Visit: Payer: Self-pay

## 2018-11-28 ENCOUNTER — Encounter: Payer: Self-pay | Admitting: Orthopaedic Surgery

## 2018-11-28 ENCOUNTER — Ambulatory Visit (INDEPENDENT_AMBULATORY_CARE_PROVIDER_SITE_OTHER): Payer: Medicare PPO | Admitting: Orthopaedic Surgery

## 2018-11-28 ENCOUNTER — Ambulatory Visit (INDEPENDENT_AMBULATORY_CARE_PROVIDER_SITE_OTHER): Payer: Medicare PPO

## 2018-11-28 VITALS — BP 180/106 | HR 83 | Temp 98.8°F | Ht 63.0 in | Wt 244.0 lb

## 2018-11-28 DIAGNOSIS — M545 Low back pain, unspecified: Secondary | ICD-10-CM

## 2018-11-28 DIAGNOSIS — G8929 Other chronic pain: Secondary | ICD-10-CM | POA: Diagnosis not present

## 2018-11-28 DIAGNOSIS — Z6841 Body Mass Index (BMI) 40.0 and over, adult: Secondary | ICD-10-CM

## 2018-11-28 DIAGNOSIS — M5136 Other intervertebral disc degeneration, lumbar region: Secondary | ICD-10-CM

## 2018-11-28 DIAGNOSIS — M25561 Pain in right knee: Secondary | ICD-10-CM | POA: Diagnosis not present

## 2018-11-28 MED ORDER — HYDROCODONE-ACETAMINOPHEN 5-325 MG PO TABS: ORAL_TABLET | ORAL | 0 refills | Status: DC

## 2018-11-28 NOTE — Progress Notes (Signed)
Patient Stacy [name], female DOB:Jul 01, 1955, 63 y.o. [id number]  Chief Complaint  Patient presents with  . Back Pain  . Knee Pain    HPI  Stacy [name] is a 63 y.o. female who has chronic right knee pain. She has swelling and popping but no giving way. She wants an injection.  She has developed lower back pain with no radiation.  It is very tender and hurts a lot. She has tried ice, heat, rubs and rest without help. She has pain medicine for the knee and that helps some. She has no trauma. She has no weakness.   Body mass index is 43.22 kg/m.  The patient meets the AMA guidelines for Morbid (severe) obesity with a BMI > 40.0 and I have recommended weight loss.   ROS  Review of Systems  Constitutional: Positive for activity change.  HENT: Negative for congestion.   Respiratory: Negative for cough and shortness of breath.   Cardiovascular: Negative for chest pain and leg swelling.  Endocrine: Positive for cold intolerance.  Musculoskeletal: Positive for arthralgias, back pain, gait problem and joint swelling.  Allergic/Immunologic: Positive for environmental allergies.  All other systems reviewed and are negative.   All other systems reviewed and are negative.  The following is a summary of the past history medically, past history surgically, known current medicines, social history and family history.  This information is gathered electronically by the computer from prior information and documentation.  I review this each visit and have found including this information at this point in the chart is beneficial and informative.    Past Medical History:  Diagnosis Date  . Arthritis   . Hypertension     Past Surgical History:  Procedure Laterality Date  . TUBAL LIGATION      Family History  Problem Relation Age of Onset  . COPD Mother   . Arthritis Mother   . Heart attack Mother   . Cancer Father   . Hypertension Father   . Arthritis Sister   . Hypertension  Sister   . Arthritis Brother   . Hypertension Brother     Social History Social History   Tobacco Use  . Smoking status: Never Smoker  . Smokeless tobacco: Never Used  Substance Use Topics  . Alcohol use: Not on file  . Drug use: Not on file    No Known Allergies  Current Outpatient Medications  Medication Sig Dispense Refill  . diclofenac (VOLTAREN) 75 MG EC tablet TAKE 1 TABLET BY MOUTH TWICE DAILY WITH A MEAL 60 tablet 2  . HYDROcodone-acetaminophen (NORCO/VICODIN) 5-325 MG tablet One tablet by mouth every six hours as needed for pain. 56 tablet 0   No current facility-administered medications for this visit.      Physical Exam  Blood pressure (!) 180/106, pulse 83, temperature 98.8 F (37.1 C), height 5\' 3"  (1.6 m), weight 244 lb (110.7 kg).  Constitutional: overall normal hygiene, normal nutrition, well developed, normal grooming, normal body habitus. Assistive device:none  Musculoskeletal: gait and station Limp right, muscle tone and strength are normal, no tremors or atrophy is present.  .  Neurological: coordination overall normal.  Deep tendon reflex/nerve stretch intact.  Sensation normal.  Cranial nerves II-XII intact.   Skin:   Normal overall no scars, lesions, ulcers or rashes. No psoriasis.  Psychiatric: Alert and oriented x 3.  Recent memory intact, remote memory unclear.  Normal mood and affect. Well groomed.  Good eye contact.  Cardiovascular: overall no swelling, no varicosities, no  edema bilaterally, normal temperatures of the legs and arms, no clubbing, cyanosis and good capillary refill.  Lymphatic: palpation is normal.  Right knee has tenderness, slight effusion, ROM 0 to 105, crepitus, stable, NV intact.  Spine/Pelvis examination:  Inspection:  Overall, sacoiliac joint benign and hips nontender; without crepitus or defects.   Thoracic spine inspection: Alignment normal without kyphosis present   Lumbar spine inspection:  Alignment  without  normal lumbar lordosis, without scoliosis apparent.   Thoracic spine palpation:  without tenderness of spinal processes   Lumbar spine palpation: with tenderness of lumbar area; with tightness of lumbar muscles    Range of Motion:   Lumbar flexion, forward flexion is 5 with pain or tenderness    Lumbar extension is 5 with pain or tenderness   Left lateral bend is Normal  without pain or tenderness   Right lateral bend is Normal without pain or tenderness   Straight leg raising is Normal   Strength & tone: Normal   Stability overall normal stability   All other systems reviewed and are negative   The patient has been educated about the nature of the problem(s) and counseled on treatment options.  The patient appeared to understand what I have discussed and is in agreement with it.  Encounter Diagnoses  Name Primary?  . Acute midline low back pain without sciatica Yes  . Other intervertebral disc degeneration, lumbar region   . Chronic pain of right knee   . Body mass index 40.0-44.9, adult (HCC)   . Morbid obesity (HCC)    X-rays were done of the lumbar spine, reported separately.  PROCEDURE NOTE:  The patient requests injections of the right knee , verbal consent was obtained.  The right knee was prepped appropriately after time out was performed.   Sterile technique was observed and injection of 1 cc of Depo-Medrol 40 mg with several cc's of plain xylocaine. Anesthesia was provided by ethyl chloride and a 20-gauge needle was used to inject the knee area. The injection was tolerated well.  A band aid dressing was applied.  The patient was advised to apply ice later today and tomorrow to the injection sight as needed.  PLAN Call if any problems.  Precautions discussed.  Continue current medications.   Return to clinic after MRI of the lumbar spine.   She has changes on the x-rays and I am concerned about the loss of lordosis, the spondylolisthesis and the DJD changes.  I  am concerned about HNP.   I have reviewed the [place] Controlled Substance Reporting System web site prior to prescribing narcotic medicine for this patient.   Electronically Signed Darreld McleanWayne Charlcie Prisco, MD 6/4/202011:19 AM

## 2018-12-10 ENCOUNTER — Ambulatory Visit (HOSPITAL_COMMUNITY)
Admission: RE | Admit: 2018-12-10 | Discharge: 2018-12-10 | Disposition: A | Discharge status: Home / Self Care | Payer: Medicare PPO | Source: Ambulatory Visit | Attending: Orthopaedic Surgery | Admitting: Orthopaedic Surgery

## 2018-12-10 ENCOUNTER — Other Ambulatory Visit: Payer: Self-pay

## 2018-12-10 DIAGNOSIS — M5136 Other intervertebral disc degeneration, lumbar region: Secondary | ICD-10-CM | POA: Diagnosis not present

## 2018-12-10 IMAGING — MR MRI LUMBAR SPINE WITHOUT CONTRAST
4 of 5 series · 15 of 48 positions shown · non-contrast
Comparison: [DATE] lumbar spine radiographs.

CLINICAL DATA: 62 y/o F; lower back pain radiating to both legs for
2 years.

EXAM:
MRI LUMBAR SPINE WITHOUT CONTRAST
TECHNIQUE: Multiplanar, multisequence MR imaging of the lumbar spine was
performed. No intravenous contrast was administered.

[Series 3: T2 · sagittal · 4.0mm · 0.69mm/px · 6 of 15 slices shown (1 of 2)]
[im 1/15]
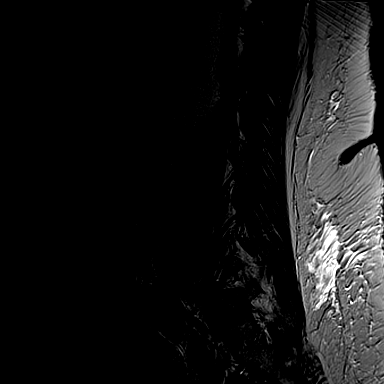
[im 3/15]
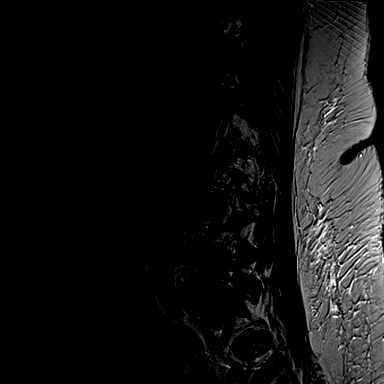
[im 5/15]
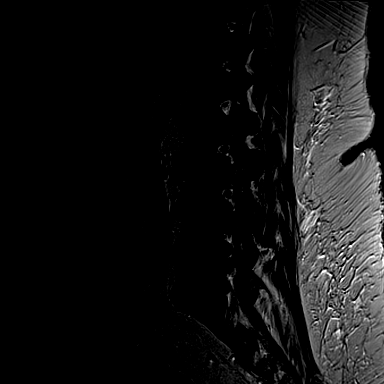
[im 8/15]
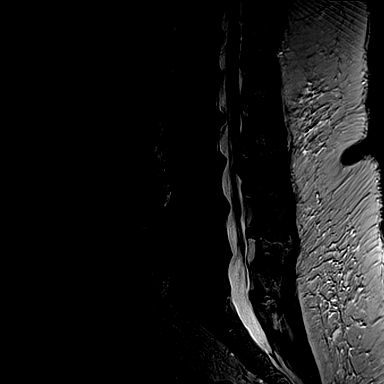
[im 10/15]
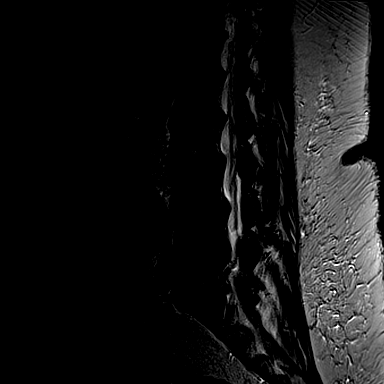
[im 12/15]
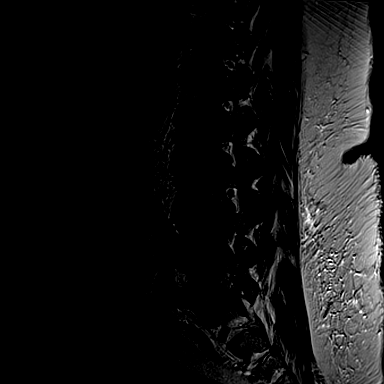

[Series 4: T1 · sagittal · 4.0mm · 0.34mm/px · 3 of 15 slices shown (1 of 2)]
[im 3/15]
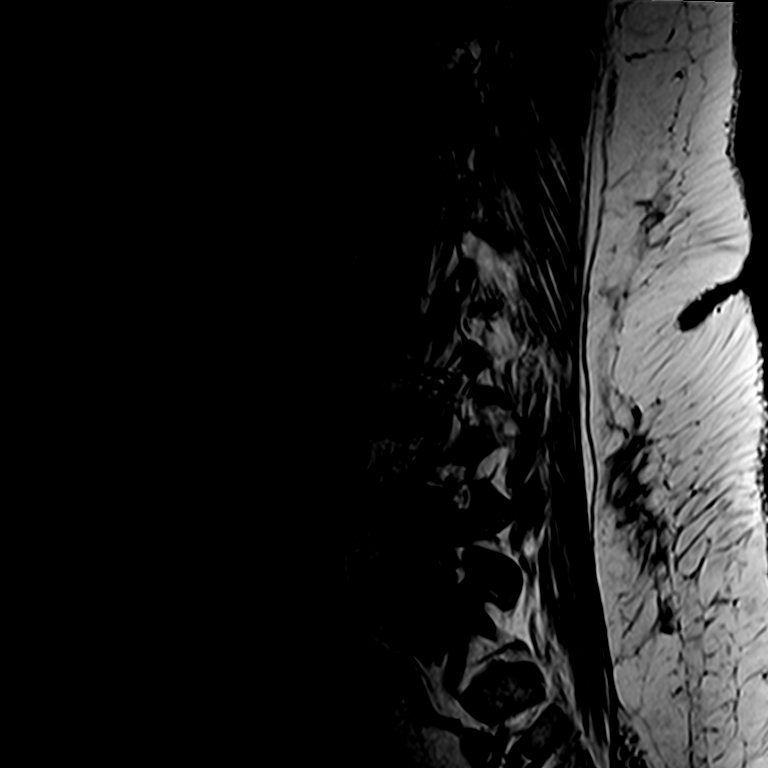
[im 8/15]
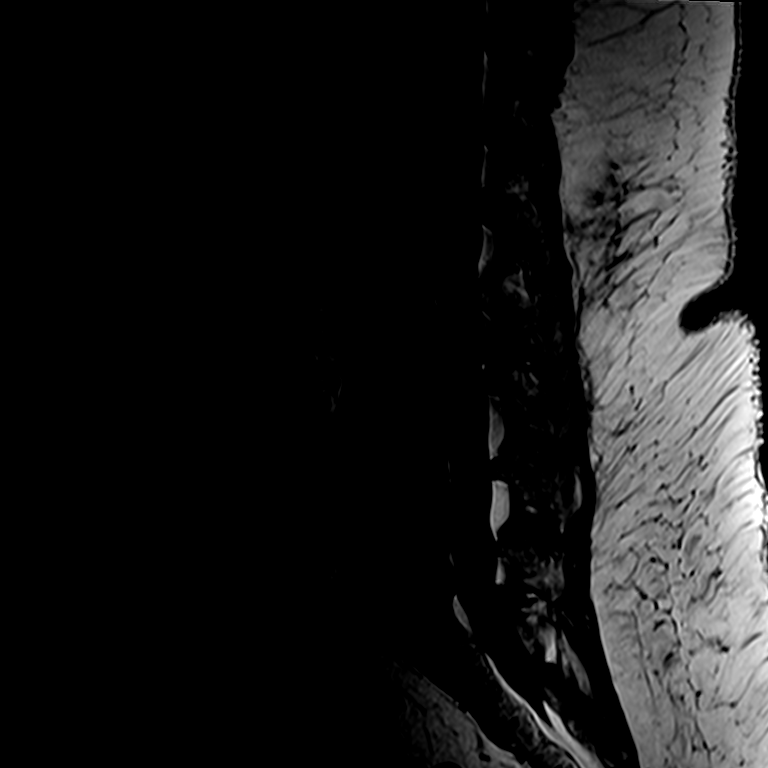
[im 12/15]
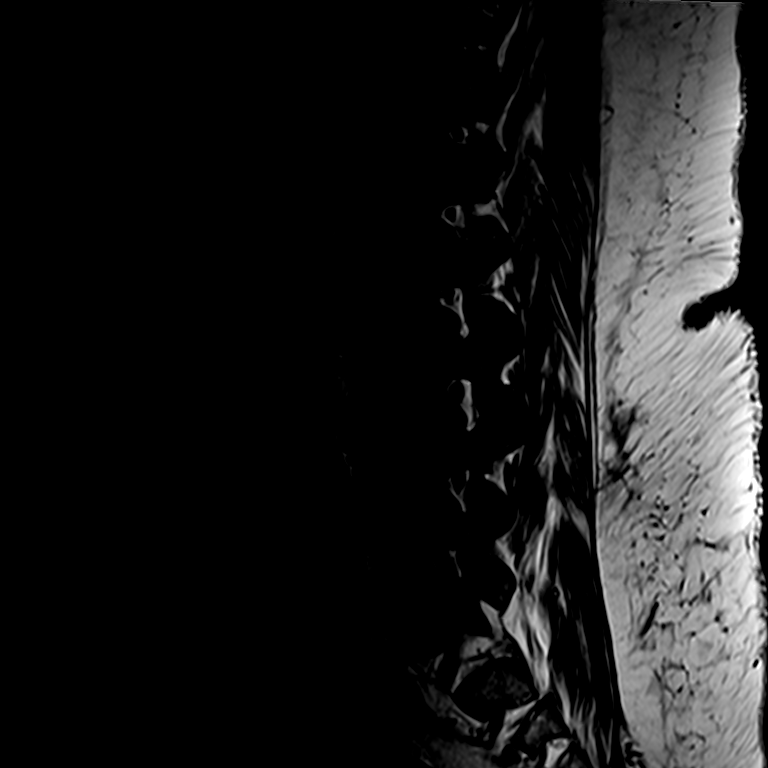

[Series 6: T2 · axial · 4.0mm · 0.23mm/px · z∈[-80,+25]mm · 3 of 34 slices shown (2 of 2)]
[im 6/34]
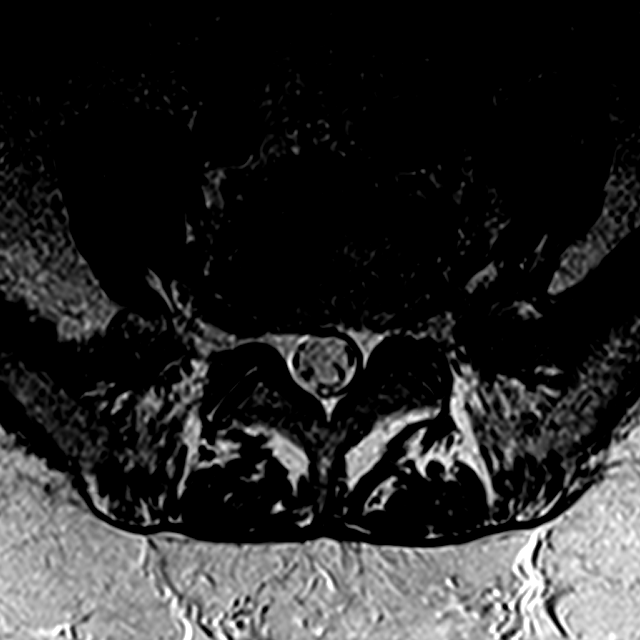
[im 18/34]
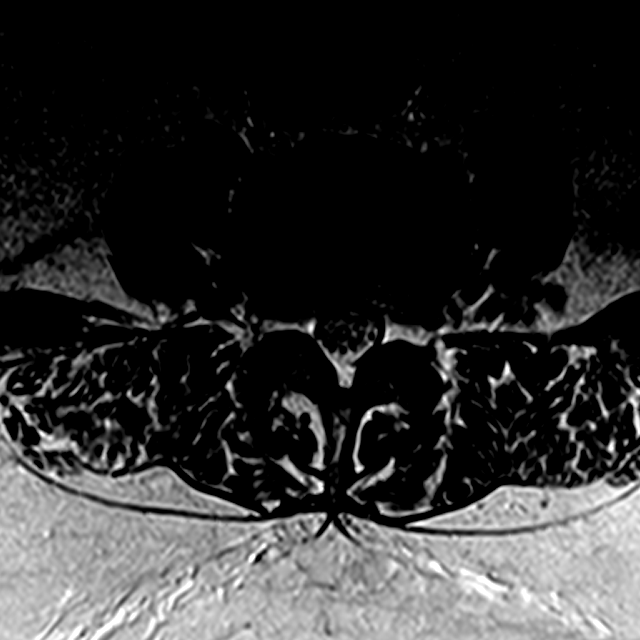
[im 28/34]
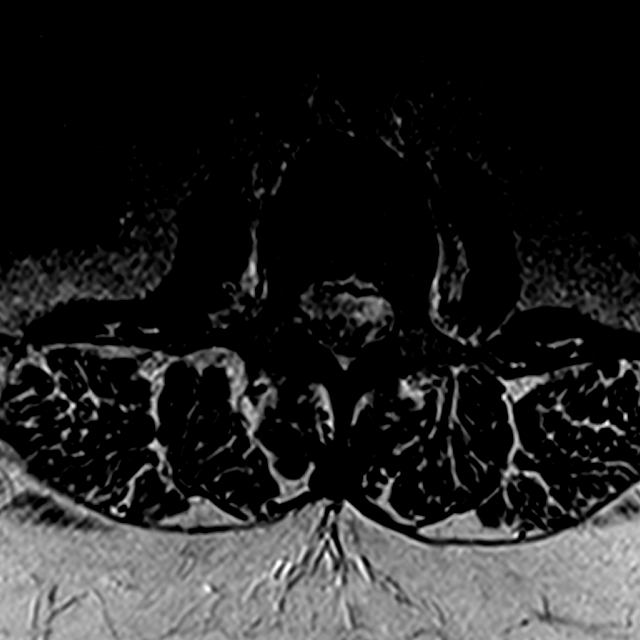

[Series 7: T1 · axial · 4.0mm · 0.23mm/px · z∈[-80,+25]mm · 3 of 34 slices shown (2 of 2)]
[im 6/34]
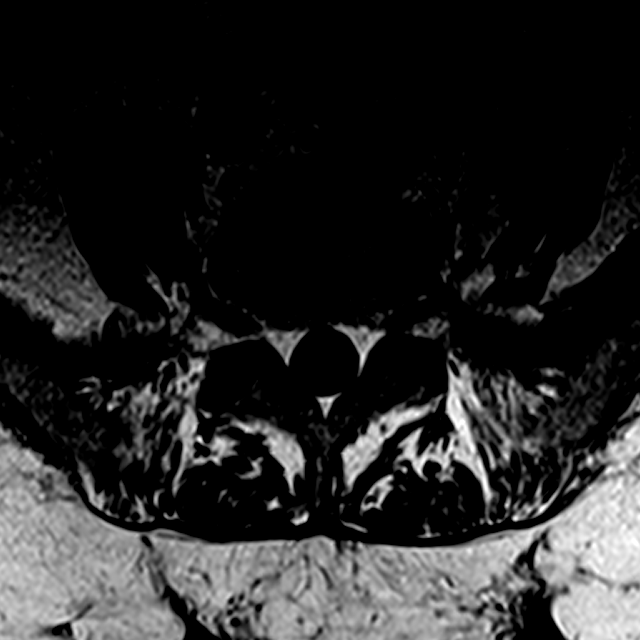
[im 18/34]
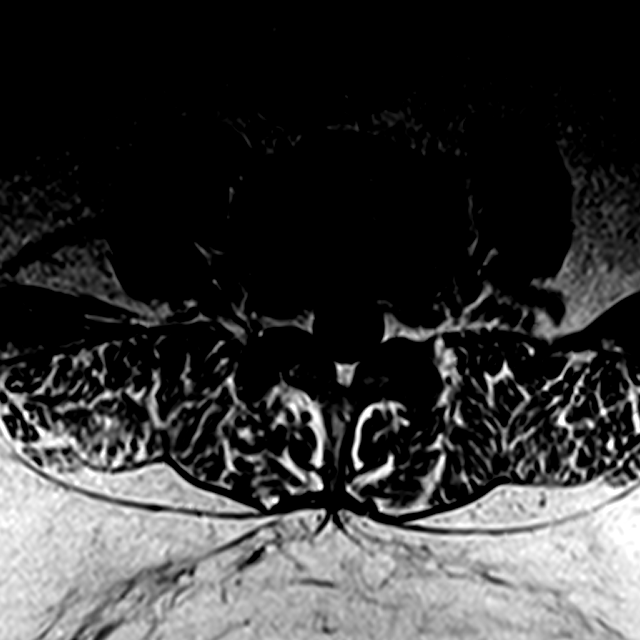
[im 28/34]
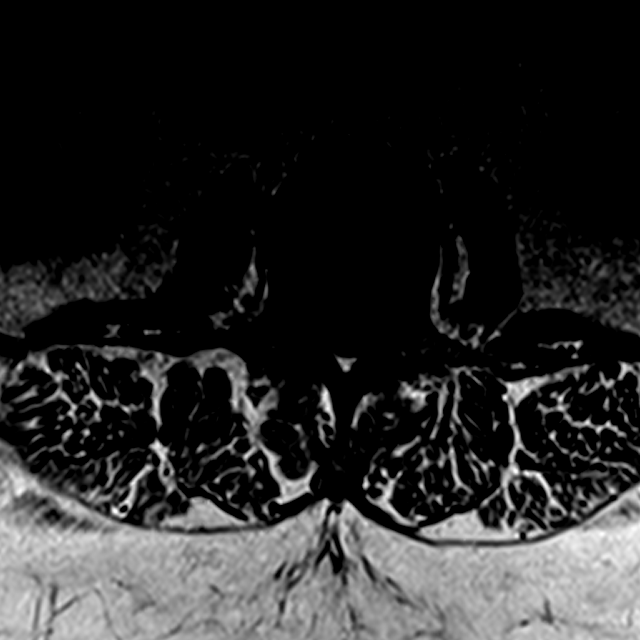

[15 of 48 positions shown; findings below may reference images not displayed]

FINDINGS: Segmentation:  Standard.

Alignment: L2-3 grade 1 anterolisthesis. Straightening of lumbar
lordosis.

Vertebrae:  L1 hemangioma.  No findings of fracture or discitis.

Conus medullaris and cauda equina: Conus extends to the L1 level.
Conus and cauda equina appear normal.

Paraspinal and other soft tissues: Negative.

Disc levels:

Disc bulges are present at the T11-12 and T12-L1 levels resulting in
mild bilateral neural foraminal stenosis visible on sagittal view.

L1-2: Disc bulge with small central extrusion migrated inferiorly
and facet hypertrophy. Mild bilateral foraminal stenosis. No
significant spinal canal stenosis.

L2-3: Grade 1 anterolisthesis with small uncovered disc bulge,
endplate marginal osteophytes, as well as facet and ligamentum
flavum hypertrophy. Mild bilateral neural foraminal stenosis. Mild
narrowing of lateral recesses. No significant spinal canal stenosis.

L3-4: Disc bulge with facet and ligamentum flavum hypertrophy. Mild
bilateral neural foraminal stenosis. No significant spinal canal
stenosis.

L4-5: Disc bulge with endplate marginal osteophytes, small right
foraminal disc protrusion, and facet hypertrophy. Mild right greater
than left neural foraminal stenosis and lateral recess stenosis. No
significant spinal canal stenosis.

L5-S1: Disc bulge, right foraminal disc protrusion, endplate
marginal osteophytes in the left-greater-than-right foraminal and
extraforaminal zones. And bilateral facet hypertrophy. Mild right
and moderate left neural foraminal stenosis. No significant spinal
canal stenosis.
IMPRESSION: 1. No acute osseous abnormality. Straightening of lumbar lordosis.
L2-3 grade 1 anterolisthesis due to facet hypertrophy.
2. Moderate lumbar spondylosis with predominant discogenic
degenerative changes greatest at L5-S1 with there is moderate left
neural foraminal stenosis. Multilevel mild neural foraminal
stenosis. No significant spinal canal stenosis.

## 2018-12-12 ENCOUNTER — Ambulatory Visit (INDEPENDENT_AMBULATORY_CARE_PROVIDER_SITE_OTHER): Payer: Medicare PPO | Admitting: Orthopaedic Surgery

## 2018-12-12 ENCOUNTER — Other Ambulatory Visit: Payer: Self-pay

## 2018-12-12 ENCOUNTER — Encounter: Payer: Self-pay | Admitting: Orthopaedic Surgery

## 2018-12-12 VITALS — BP 183/99 | HR 70 | Temp 97.4°F | Ht 63.0 in | Wt 244.0 lb

## 2018-12-12 DIAGNOSIS — M5136 Other intervertebral disc degeneration, lumbar region: Secondary | ICD-10-CM

## 2018-12-12 DIAGNOSIS — M545 Low back pain, unspecified: Secondary | ICD-10-CM

## 2018-12-12 DIAGNOSIS — Z6841 Body Mass Index (BMI) 40.0 and over, adult: Secondary | ICD-10-CM | POA: Diagnosis not present

## 2018-12-12 MED ORDER — HYDROCODONE-ACETAMINOPHEN 5-325 MG PO TABS: ORAL_TABLET | ORAL | 0 refills | Status: DC

## 2018-12-12 NOTE — Progress Notes (Signed)
Patient UL:[id number] Stacy [name], female DOB:May 18, 1956, 63 y.o. [id number]  Chief Complaint  Patient presents with  . Back Pain  . Results    review MRI     HPI  Stacy [name] is a 63 y.o. female who has chronic lower back pain. She had a MRI which showed: IMPRESSION: 1. No acute osseous abnormality. Straightening of lumbar lordosis. L2-3 grade 1 anterolisthesis due to facet hypertrophy. 2. Moderate lumbar spondylosis with predominant discogenic degenerative changes greatest at L5-S1 with there is moderate left neural foraminal stenosis. Multilevel mild neural foraminal stenosis. No significant spinal canal stenosis.  I have explained the findings. She will continue her exercises and her medicine for now.   Body mass index is 43.22 kg/m.  The patient meets the AMA guidelines for Morbid (severe) obesity with a BMI > 40.0 and I have recommended weight loss.   ROS  Review of Systems  All other systems reviewed and are negative.  The following is a summary of the past history medically, past history surgically, known current medicines, social history and family history.  This information is gathered electronically by the computer from prior information and documentation.  I review this each visit and have found including this information at this point in the chart is beneficial and informative.    Past Medical History:  Diagnosis Date  . Arthritis   . Hypertension     Past Surgical History:  Procedure Laterality Date  . TUBAL LIGATION      Family History  Problem Relation Age of Onset  . COPD Mother   . Arthritis Mother   . Heart attack Mother   . Cancer Father   . Hypertension Father   . Arthritis Sister   . Hypertension Sister   . Arthritis Brother   . Hypertension Brother     Social History Social History   Tobacco Use  . Smoking status: Never Smoker  . Smokeless tobacco: Never Used  Substance Use Topics  . Alcohol use: Not on file  . Drug use: Not on  file    No Known Allergies  Current Outpatient Medications  Medication Sig Dispense Refill  . diclofenac (VOLTAREN) 75 MG EC tablet TAKE 1 TABLET BY MOUTH TWICE DAILY WITH A MEAL 60 tablet 2  . hydrochlorothiazide (HYDRODIURIL) 12.5 MG tablet Take 12.5 mg by mouth daily.    Marland Kitchen HYDROcodone-acetaminophen (NORCO/VICODIN) 5-325 MG tablet One tablet by mouth every six hours as needed for pain. 56 tablet 0  . losartan (COZAAR) 50 MG tablet Take 50 mg by mouth daily.     No current facility-administered medications for this visit.      Physical Exam  Blood pressure (!) 183/99, pulse 70, temperature (!) 97.4 F (36.3 C), height 5\' 3"  (1.6 m), weight 244 lb (110.7 kg).  Constitutional: overall normal hygiene, normal nutrition, well developed, normal grooming, normal body habitus. Assistive device:none  Musculoskeletal: gait and station Limp none, muscle tone and strength are normal, no tremors or atrophy is present.  .  Neurological: coordination overall normal.  Deep tendon reflex/nerve stretch intact.  Sensation normal.  Cranial nerves II-XII intact.   Skin:   Normal overall no scars, lesions, ulcers or rashes. No psoriasis.  Psychiatric: Alert and oriented x 3.  Recent memory intact, remote memory unclear.  Normal mood and affect. Well groomed.  Good eye contact.  Cardiovascular: overall no swelling, no varicosities, no edema bilaterally, normal temperatures of the legs and arms, no clubbing, cyanosis and good capillary refill.  Lymphatic: palpation  is normal.  Spine/Pelvis examination:  Inspection:  Overall, sacoiliac joint benign and hips nontender; without crepitus or defects.   Thoracic spine inspection: Alignment normal without kyphosis present   Lumbar spine inspection:  Alignment  with normal lumbar lordosis, without scoliosis apparent.   Thoracic spine palpation:  without tenderness of spinal processes   Lumbar spine palpation: without tenderness of lumbar area; without  tightness of lumbar muscles    Range of Motion:   Lumbar flexion, forward flexion is normal without pain or tenderness    Lumbar extension is full without pain or tenderness   Left lateral bend is normal without pain or tenderness   Right lateral bend is normal without pain or tenderness   Straight leg raising is normal  Strength & tone: normal   Stability overall normal stability  All other systems reviewed and are negative   The patient has been educated about the nature of the problem(s) and counseled on treatment options.  The patient appeared to understand what I have discussed and is in agreement with it.  Encounter Diagnoses  Name Primary?  . Acute midline low back pain without sciatica Yes  . Other intervertebral disc degeneration, lumbar region   . Body mass index 40.0-44.9, adult (HCC)   . Morbid obesity (HCC)     PLAN Call if any problems.  Precautions discussed.  Continue current medications.   Return to clinic 3 weeks   Electronically Signed Darreld McleanWayne Tomio Kirk, MD 6/18/202010:56 AM

## 2019-01-02 ENCOUNTER — Other Ambulatory Visit: Payer: Self-pay

## 2019-01-02 ENCOUNTER — Ambulatory Visit (INDEPENDENT_AMBULATORY_CARE_PROVIDER_SITE_OTHER): Payer: Medicare PPO | Admitting: Orthopaedic Surgery

## 2019-01-02 ENCOUNTER — Encounter: Payer: Self-pay | Admitting: Orthopaedic Surgery

## 2019-01-02 VITALS — BP 164/97 | HR 72 | Ht 63.0 in | Wt 244.0 lb

## 2019-01-02 DIAGNOSIS — G8929 Other chronic pain: Secondary | ICD-10-CM

## 2019-01-02 DIAGNOSIS — M545 Low back pain: Secondary | ICD-10-CM | POA: Diagnosis not present

## 2019-01-02 MED ORDER — HYDROCODONE-ACETAMINOPHEN 5-325 MG PO TABS: ORAL_TABLET | ORAL | 0 refills | Status: DC

## 2019-01-02 NOTE — Progress Notes (Signed)
Patient Stacy [name], female DOB:July 25, 1955, 63 y.o. [id number]  Chief Complaint  Patient presents with  . Back Pain  . Medication Refill    HPI  Stacy [name] is a 63 y.o. female who has chronic lower back pain.  She has good and bad days. She is taking her medicine and doing her exercises. She has no numbness,no weakness, no new trauma.   Body mass index is 43.22 kg/m.  The patient meets the AMA guidelines for Morbid (severe) obesity with a BMI > 40.0 and I have recommended weight loss.   ROS  Review of Systems  Constitutional: Positive for activity change.  HENT: Negative for congestion.   Respiratory: Negative for cough and shortness of breath.   Cardiovascular: Negative for chest pain and leg swelling.  Endocrine: Positive for cold intolerance.  Musculoskeletal: Positive for arthralgias, back pain, gait problem and joint swelling.  Allergic/Immunologic: Positive for environmental allergies.  All other systems reviewed and are negative.   All other systems reviewed and are negative.  The following is a summary of the past history medically, past history surgically, known current medicines, social history and family history.  This information is gathered electronically by the computer from prior information and documentation.  I review this each visit and have found including this information at this point in the chart is beneficial and informative.    Past Medical History:  Diagnosis Date  . Arthritis   . Hypertension     Past Surgical History:  Procedure Laterality Date  . TUBAL LIGATION      Family History  Problem Relation Age of Onset  . COPD Mother   . Arthritis Mother   . Heart attack Mother   . Cancer Father   . Hypertension Father   . Arthritis Sister   . Hypertension Sister   . Arthritis Brother   . Hypertension Brother     Social History Social History   Tobacco Use  . Smoking status: Never Smoker  . Smokeless tobacco: Never Used   Substance Use Topics  . Alcohol use: Not on file  . Drug use: Not on file    No Known Allergies  Current Outpatient Medications  Medication Sig Dispense Refill  . diclofenac (VOLTAREN) 75 MG EC tablet TAKE 1 TABLET BY MOUTH TWICE DAILY WITH A MEAL 60 tablet 2  . hydrochlorothiazide (HYDRODIURIL) 12.5 MG tablet Take 12.5 mg by mouth daily.    Marland Kitchen HYDROcodone-acetaminophen (NORCO/VICODIN) 5-325 MG tablet One tablet by mouth every six hours as needed for pain. 56 tablet 0  . losartan (COZAAR) 50 MG tablet Take 50 mg by mouth daily.     No current facility-administered medications for this visit.      Physical Exam  Blood pressure (!) 164/97, pulse 72, height 5\' 3"  (1.6 m), weight 244 lb (110.7 kg).  Constitutional: overall normal hygiene, normal nutrition, well developed, normal grooming, normal body habitus. Assistive device:none  Musculoskeletal: gait and station Limp none, muscle tone and strength are normal, no tremors or atrophy is present.  .  Neurological: coordination overall normal.  Deep tendon reflex/nerve stretch intact.  Sensation normal.  Cranial nerves II-XII intact.   Skin:   Normal overall no scars, lesions, ulcers or rashes. No psoriasis.  Psychiatric: Alert and oriented x 3.  Recent memory intact, remote memory unclear.  Normal mood and affect. Well groomed.  Good eye contact.  Cardiovascular: overall no swelling, no varicosities, no edema bilaterally, normal temperatures of the legs and arms, no clubbing, cyanosis  and good capillary refill.  Spine/Pelvis examination:  Inspection:  Overall, sacoiliac joint benign and hips nontender; without crepitus or defects.   Thoracic spine inspection: Alignment normal without kyphosis present   Lumbar spine inspection:  Alignment  with normal lumbar lordosis, without scoliosis apparent.   Thoracic spine palpation:  without tenderness of spinal processes   Lumbar spine palpation: without tenderness of lumbar area;  without tightness of lumbar muscles    Range of Motion:   Lumbar flexion, forward flexion is normal without pain or tenderness    Lumbar extension is full without pain or tenderness   Left lateral bend is normal without pain or tenderness   Right lateral bend is normal without pain or tenderness   Straight leg raising is normal  Strength & tone: normal   Stability overall normal stability  Lymphatic: palpation is normal.  All other systems reviewed and are negative   The patient has been educated about the nature of the problem(s) and counseled on treatment options.  The patient appeared to understand what I have discussed and is in agreement with it.  Encounter Diagnosis  Name Primary?  . Chronic midline low back pain without sciatica Yes    PLAN Call if any problems.  Precautions discussed.  Continue current medications.   Return to clinic 1 month   I [facility] the [facility] [place] [facility] [facility] web site prior to prescribing narcotic medicine for this patient.   Electronically Signed Darreld McleanWayne Dario Yono, MD 7/9/202010:42 AM

## 2019-01-30 ENCOUNTER — Encounter: Payer: Self-pay | Admitting: Orthopaedic Surgery

## 2019-01-30 ENCOUNTER — Other Ambulatory Visit: Payer: Self-pay

## 2019-01-30 ENCOUNTER — Ambulatory Visit (INDEPENDENT_AMBULATORY_CARE_PROVIDER_SITE_OTHER): Payer: Medicare PPO | Admitting: Orthopaedic Surgery

## 2019-01-30 VITALS — BP 163/78 | HR 68 | Temp 98.6°F | Ht 63.0 in | Wt 244.0 lb

## 2019-01-30 DIAGNOSIS — M545 Low back pain, unspecified: Secondary | ICD-10-CM

## 2019-01-30 DIAGNOSIS — G8929 Other chronic pain: Secondary | ICD-10-CM

## 2019-01-30 MED ORDER — HYDROCODONE-ACETAMINOPHEN 5-325 MG PO TABS: ORAL_TABLET | ORAL | 0 refills | Status: DC

## 2019-01-30 NOTE — Progress Notes (Signed)
Patient ZO:XWRUEA:Stacy [name], female DOB:[birth year], 63 y.o. [id number]  Chief Complaint  Patient presents with  . Back Pain    HPI  Stacy [name] is a 63 y.o. female who has chronic lower back pain. She has good and bad days. She does better in the summer but the rainy weather makes her worse as does the air conditioning.  She has no numbness,no weakness.  She has no new trauma. She is taking her medicine.   Body mass index is 43.22 kg/m.  The patient meets the AMA guidelines for Morbid (severe) obesity with a BMI > 40.0 and I have recommended weight loss.   ROS  Review of Systems  Constitutional: Positive for activity change.  HENT: Negative for congestion.   Respiratory: Negative for cough and shortness of breath.   Cardiovascular: Negative for chest pain and leg swelling.  Endocrine: Positive for cold intolerance.  Musculoskeletal: Positive for arthralgias, back pain, gait problem and joint swelling.  Allergic/Immunologic: Positive for environmental allergies.  All other systems reviewed and are negative.   All other systems reviewed and are negative.  The following is a summary of the past history medically, past history surgically, known current medicines, social history and family history.  This information is gathered electronically by the computer from prior information and documentation.  I review this each visit and have found including this information at this point in the chart is beneficial and informative.    Past Medical History:  Diagnosis Date  . Arthritis   . Hypertension     Past Surgical History:  Procedure Laterality Date  . TUBAL LIGATION      Family History  Problem Relation Age of Onset  . COPD Mother   . Arthritis Mother   . Heart attack Mother   . Cancer Father   . Hypertension Father   . Arthritis Sister   . Hypertension Sister   . Arthritis Brother   . Hypertension Brother     Social History Social History   Tobacco Use  .  Smoking status: Never Smoker  . Smokeless tobacco: Never Used  Substance Use Topics  . Alcohol use: Not on file  . Drug use: Not on file    No Known Allergies  Current Outpatient Medications  Medication Sig Dispense Refill  . atorvastatin (LIPITOR) 20 MG tablet Take 20 mg by mouth daily.    . Cholecalciferol (VITAMIN D3) 1.25 MG (50000 UT) CAPS Take 1 capsule by mouth once a week.    . diclofenac (VOLTAREN) 75 MG EC tablet TAKE 1 TABLET BY MOUTH TWICE DAILY WITH A MEAL 60 tablet 2  . hydrochlorothiazide (HYDRODIURIL) 12.5 MG tablet Take 12.5 mg by mouth daily.    Marland Kitchen. HYDROcodone-acetaminophen (NORCO/VICODIN) 5-325 MG tablet One tablet by mouth every six hours as needed for pain. 56 tablet 0  . losartan (COZAAR) 50 MG tablet Take 50 mg by mouth daily.     No current facility-administered medications for this visit.      Physical Exam  Blood pressure (!) 163/78, pulse 68, height 5\' 3"  (1.6 m), weight 244 lb (110.7 kg).  Constitutional: overall normal hygiene, normal nutrition, well developed, normal grooming, normal body habitus. Assistive device:none  Musculoskeletal: gait and station Limp none, muscle tone and strength are normal, no tremors or atrophy is present.  .  Neurological: coordination overall normal.  Deep tendon reflex/nerve stretch intact.  Sensation normal.  Cranial nerves II-XII intact.   Skin:   Normal overall no scars, lesions, ulcers or  rashes. No psoriasis.  Psychiatric: Alert and oriented x 3.  Recent memory intact, remote memory unclear.  Normal mood and affect. Well groomed.  Good eye contact.  Cardiovascular: overall no swelling, no varicosities, no edema bilaterally, normal temperatures of the legs and arms, no clubbing, cyanosis and good capillary refill.  Lymphatic: palpation is normal.  Spine/Pelvis examination:  Inspection:  Overall, sacoiliac joint benign and hips nontender; without crepitus or defects.   Thoracic spine inspection: Alignment  normal without kyphosis present   Lumbar spine inspection:  Alignment  with normal lumbar lordosis, without scoliosis apparent.   Thoracic spine palpation:  without tenderness of spinal processes   Lumbar spine palpation: without tenderness of lumbar area; without tightness of lumbar muscles    Range of Motion:   Lumbar flexion, forward flexion is normal without pain or tenderness    Lumbar extension is full without pain or tenderness   Left lateral bend is normal without pain or tenderness   Right lateral bend is normal without pain or tenderness   Straight leg raising is normal  Strength & tone: normal   Stability overall normal stability  All other systems reviewed and are negative   The patient has been educated about the nature of the problem(s) and counseled on treatment options.  The patient appeared to understand what I have discussed and is in agreement with it.  Encounter Diagnosis  Name Primary?  . Chronic midline low back pain without sciatica Yes    PLAN Call if any problems.  Precautions discussed.  Continue current medications.   Return to clinic 6 weeks   I have reviewed the [place] web site prior to prescribing narcotic medicine for this patient.   Electronically Signed Sanjuana Kava, MD 8/6/202010:21 AM

## 2019-02-20 ENCOUNTER — Other Ambulatory Visit: Payer: Self-pay | Admitting: Orthopaedic Surgery

## 2019-02-20 MED ORDER — HYDROCODONE-ACETAMINOPHEN 5-325 MG PO TABS: ORAL_TABLET | ORAL | 0 refills | Status: DC

## 2019-02-20 NOTE — Telephone Encounter (Signed)
Patient of Dr. Brooke Bonito requests refill on Hydrocodone/Acetaminophen 5-325  Mgs.  Qty  37  Sig: One tablet by mouth every six hours as needed for pain.  Patient uses [name] in [place]

## 2019-03-12 ENCOUNTER — Ambulatory Visit: Payer: Medicare PPO | Admitting: Orthopaedic Surgery

## 2019-04-01 ENCOUNTER — Ambulatory Visit (INDEPENDENT_AMBULATORY_CARE_PROVIDER_SITE_OTHER): Payer: Medicare PPO | Admitting: Orthopaedic Surgery

## 2019-04-01 ENCOUNTER — Other Ambulatory Visit: Payer: Self-pay

## 2019-04-01 ENCOUNTER — Encounter: Payer: Self-pay | Admitting: Orthopaedic Surgery

## 2019-04-01 VITALS — BP 141/82 | HR 75 | Temp 97.0°F | Ht 62.0 in | Wt 260.0 lb

## 2019-04-01 DIAGNOSIS — Z6841 Body Mass Index (BMI) 40.0 and over, adult: Secondary | ICD-10-CM

## 2019-04-01 DIAGNOSIS — M25561 Pain in right knee: Secondary | ICD-10-CM

## 2019-04-01 DIAGNOSIS — M545 Low back pain: Secondary | ICD-10-CM | POA: Diagnosis not present

## 2019-04-01 DIAGNOSIS — G8929 Other chronic pain: Secondary | ICD-10-CM

## 2019-04-01 MED ORDER — DICLOFENAC SODIUM 75 MG PO TBEC: DELAYED_RELEASE_TABLET | ORAL | 2 refills | Status: DC

## 2019-04-01 MED ORDER — HYDROCODONE-ACETAMINOPHEN 5-325 MG PO TABS: ORAL_TABLET | ORAL | 0 refills | Status: DC

## 2019-04-01 NOTE — Progress Notes (Signed)
Patient YK:DXIPJA Stacy [name], female DOB:[birth year], 63 y.o. [id number]  Chief Complaint  Patient presents with  . Back Pain    increase pain no pain medication     HPI  Stacy [name] is a 63 y.o. female who has chronic lower back pain and right knee pain.  She has no new trauma.  She has more lower back pain. She had COVID in early September.  She is doing well now and did not have to be hospitalized.  She is out of her medicine.  I will refill.   Body mass index is 47.55 kg/m.  The patient meets the AMA guidelines for Morbid (severe) obesity with a BMI > 40.0 and I have recommended weight loss.   ROS  Review of Systems  Constitutional: Positive for activity change.  HENT: Negative for congestion.   Respiratory: Negative for cough and shortness of breath.   Cardiovascular: Negative for chest pain and leg swelling.  Endocrine: Positive for cold intolerance.  Musculoskeletal: Positive for arthralgias, back pain, gait problem and joint swelling.  Allergic/Immunologic: Positive for environmental allergies.  All other systems reviewed and are negative.   All other systems reviewed and are negative.  The following is a summary of the past history medically, past history surgically, known current medicines, social history and family history.  This information is gathered electronically by the computer from prior information and documentation.  I review this each visit and have found including this information at this point in the chart is beneficial and informative.    Past Medical History:  Diagnosis Date  . Arthritis   . Hypertension     Past Surgical History:  Procedure Laterality Date  . TUBAL LIGATION      Family History  Problem Relation Age of Onset  . COPD Mother   . Arthritis Mother   . Heart attack Mother   . Cancer Father   . Hypertension Father   . Arthritis Sister   . Hypertension Sister   . Arthritis Brother   . Hypertension Brother     Social  History Social History   Tobacco Use  . Smoking status: Never Smoker  . Smokeless tobacco: Never Used  Substance Use Topics  . Alcohol use: Not on file  . Drug use: Not on file    No Known Allergies  Current Outpatient Medications  Medication Sig Dispense Refill  . atorvastatin (LIPITOR) 20 MG tablet Take 20 mg by mouth daily.    . diclofenac (VOLTAREN) 75 MG EC tablet TAKE 1 TABLET BY MOUTH TWICE DAILY WITH A MEAL 60 tablet 2  . hydrochlorothiazide (HYDRODIURIL) 12.5 MG tablet Take 12.5 mg by mouth daily.    Marland Kitchen losartan (COZAAR) 50 MG tablet Take 50 mg by mouth daily.    Marland Kitchen HYDROcodone-acetaminophen (NORCO/VICODIN) 5-325 MG tablet One tablet by mouth every six hours as needed for pain. 56 tablet 0   No current facility-administered medications for this visit.      Physical Exam  Blood pressure (!) 141/82, pulse 75, temperature (!) 97 F (36.1 C), height 5\' 2"  (1.575 m), weight 260 lb (117.9 kg).  Constitutional: overall normal hygiene, normal nutrition, well developed, normal grooming, normal body habitus. Assistive device:none  Musculoskeletal: gait and station Limp none, muscle tone and strength are normal, no tremors or atrophy is present.  .  Neurological: coordination overall normal.  Deep tendon reflex/nerve stretch intact.  Sensation normal.  Cranial nerves II-XII intact.   Skin:   Normal overall no scars, lesions, ulcers  or rashes. No psoriasis.  Psychiatric: Alert and oriented x 3.  Recent memory intact, remote memory unclear.  Normal mood and affect. Well groomed.  Good eye contact.  Cardiovascular: overall no swelling, no varicosities, no edema bilaterally, normal temperatures of the legs and arms, no clubbing, cyanosis and good capillary refill.  Spine/Pelvis examination:  Inspection:  Overall, sacoiliac joint benign and hips nontender; without crepitus or defects.   Thoracic spine inspection: Alignment normal without kyphosis present   Lumbar spine  inspection:  Alignment  with normal lumbar lordosis, without scoliosis apparent.   Thoracic spine palpation:  without tenderness of spinal processes   Lumbar spine palpation: without tenderness of lumbar area; without tightness of lumbar muscles    Range of Motion:   Lumbar flexion, forward flexion is normal without pain or tenderness    Lumbar extension is full without pain or tenderness   Left lateral bend is normal without pain or tenderness   Right lateral bend is normal without pain or tenderness   Straight leg raising is normal  Strength & tone: normal   Stability overall normal stability  Right knee has slight effusion, ROM 0 to 105, limp right, stable, NV intact.  Lymphatic: palpation is normal.  All other systems reviewed and are negative   The patient has been educated about the nature of the problem(s) and counseled on treatment options.  The patient appeared to understand what I have discussed and is in agreement with it.  Encounter Diagnoses  Name Primary?  . Chronic midline low back pain without sciatica Yes  . Chronic pain of right knee   . Body mass index 40.0-44.9, adult (HCC)   . Morbid obesity (HCC)     PLAN Call if any problems.  Precautions discussed.  Continue current medications.   Return to clinic 2 months   I have reviewed the [facility] Controlled Substance Reporting System web site prior to prescribing narcotic medicine for this patient.   Electronically Signed Darreld Mclean, MD 10/6/202011:18 AM

## 2019-04-02 ENCOUNTER — Encounter: Payer: Self-pay | Admitting: Orthopaedic Surgery

## 2019-04-25 ENCOUNTER — Telehealth: Payer: Self-pay

## 2019-04-25 NOTE — Telephone Encounter (Signed)
Hydrocodone-Acetaminophen 5/325 mg  Qty 56 Tablets  PATIENT USES Stacy [name]

## 2019-04-26 ENCOUNTER — Other Ambulatory Visit: Payer: Self-pay | Admitting: Orthopaedic Surgery

## 2019-04-29 MED ORDER — HYDROCODONE-ACETAMINOPHEN 5-325 MG PO TABS: ORAL_TABLET | ORAL | 0 refills | Status: DC

## 2019-06-03 ENCOUNTER — Encounter: Payer: Self-pay | Admitting: Orthopaedic Surgery

## 2019-06-03 ENCOUNTER — Other Ambulatory Visit: Payer: Self-pay

## 2019-06-03 ENCOUNTER — Ambulatory Visit (INDEPENDENT_AMBULATORY_CARE_PROVIDER_SITE_OTHER): Payer: Medicare PPO | Admitting: Orthopaedic Surgery

## 2019-06-03 VITALS — BP 223/97 | HR 68 | Ht 62.0 in | Wt 260.0 lb

## 2019-06-03 DIAGNOSIS — G8929 Other chronic pain: Secondary | ICD-10-CM | POA: Diagnosis not present

## 2019-06-03 DIAGNOSIS — Z6841 Body Mass Index (BMI) 40.0 and over, adult: Secondary | ICD-10-CM

## 2019-06-03 DIAGNOSIS — M25561 Pain in right knee: Secondary | ICD-10-CM | POA: Diagnosis not present

## 2019-06-03 MED ORDER — HYDROCODONE-ACETAMINOPHEN 5-325 MG PO TABS: ORAL_TABLET | ORAL | 0 refills | Status: DC

## 2019-06-03 NOTE — Progress Notes (Signed)
PROCEDURE NOTE:  The patient requests injections of the right knee , verbal consent was obtained.  The right knee was prepped appropriately after time out was performed.   Sterile technique was observed and injection of 1 cc of Depo-Medrol 40 mg with several cc's of plain xylocaine. Anesthesia was provided by ethyl chloride and a 20-gauge needle was used to inject the knee area. The injection was tolerated well.  A band aid dressing was applied.  The patient was advised to apply ice later today and tomorrow to the injection sight as needed.  Return in two months.  Electronically Signed Sanjuana Kava, MD 12/8/202010:37 AM

## 2019-06-25 ENCOUNTER — Telehealth: Payer: Self-pay | Admitting: Orthopaedic Surgery

## 2019-06-25 NOTE — Telephone Encounter (Signed)
Patient requests refill on Hydrocodone/Acetaminophen 5-325  Mgs.  Qty  64      Sig: One tablet by mouth every six hours as needed for pain.    Patient state she uses [name]

## 2019-06-26 MED ORDER — HYDROCODONE-ACETAMINOPHEN 5-325 MG PO TABS: ORAL_TABLET | ORAL | 0 refills | Status: DC

## 2019-07-30 ENCOUNTER — Ambulatory Visit (INDEPENDENT_AMBULATORY_CARE_PROVIDER_SITE_OTHER): Payer: Medicare PPO | Admitting: Orthopaedic Surgery

## 2019-07-30 ENCOUNTER — Other Ambulatory Visit: Payer: Self-pay

## 2019-07-30 ENCOUNTER — Encounter: Payer: Self-pay | Admitting: Orthopaedic Surgery

## 2019-07-30 VITALS — BP 167/100 | HR 67 | Ht 63.0 in | Wt 263.0 lb

## 2019-07-30 DIAGNOSIS — M25561 Pain in right knee: Secondary | ICD-10-CM | POA: Diagnosis not present

## 2019-07-30 DIAGNOSIS — G8929 Other chronic pain: Secondary | ICD-10-CM

## 2019-07-30 DIAGNOSIS — M25511 Pain in right shoulder: Secondary | ICD-10-CM

## 2019-07-30 MED ORDER — HYDROCODONE-ACETAMINOPHEN 5-325 MG PO TABS: ORAL_TABLET | ORAL | 0 refills | Status: DC

## 2019-07-30 NOTE — Progress Notes (Signed)
PROCEDURE NOTE:  The patient request injection, verbal consent was obtained.  The right shoulder was prepped appropriately after time out was performed.   Sterile technique was observed and injection of 1 cc of Depo-Medrol 40 mg with several cc's of plain xylocaine. Anesthesia was provided by ethyl chloride and a 20-gauge needle was used to inject the shoulder area. A posterior approach was used.  The injection was tolerated well.  A band aid dressing was applied.  The patient was advised to apply ice later today and tomorrow to the injection sight as needed.  PROCEDURE NOTE:  The patient requests injections of the left knee , verbal consent was obtained.  The left knee was prepped appropriately after time out was performed.   Sterile technique was observed and injection of 1 cc of Depo-Medrol 40 mg with several cc's of plain xylocaine. Anesthesia was provided by ethyl chloride and a 20-gauge needle was used to inject the knee area. The injection was tolerated well.  A band aid dressing was applied.  The patient was advised to apply ice later today and tomorrow to the injection sight as needed.

## 2019-08-28 ENCOUNTER — Telehealth: Payer: Self-pay | Admitting: Orthopaedic Surgery

## 2019-08-28 NOTE — Telephone Encounter (Signed)
Patient requests refill on Hydrocodone/Acetaminophen 5-325  Mgs.  Qty  56  Sig: One tablet by mouth every six hours as needed for pain. Must last 30 days.  Patient states she uses [facility]

## 2019-09-01 MED ORDER — HYDROCODONE-ACETAMINOPHEN 5-325 MG PO TABS: ORAL_TABLET | ORAL | 0 refills | Status: DC

## 2019-10-08 ENCOUNTER — Encounter: Payer: Self-pay | Admitting: Orthopaedic Surgery

## 2019-10-08 ENCOUNTER — Other Ambulatory Visit: Payer: Self-pay

## 2019-10-08 ENCOUNTER — Ambulatory Visit (INDEPENDENT_AMBULATORY_CARE_PROVIDER_SITE_OTHER): Payer: Medicare PPO | Admitting: Orthopaedic Surgery

## 2019-10-08 DIAGNOSIS — G8929 Other chronic pain: Secondary | ICD-10-CM

## 2019-10-08 DIAGNOSIS — M25561 Pain in right knee: Secondary | ICD-10-CM | POA: Diagnosis not present

## 2019-10-08 MED ORDER — NAPROXEN 500 MG PO TABS: 500.0000 mg | ORAL_TABLET | Freq: Two times a day (BID) | ORAL | 5 refills | Status: DC

## 2019-10-08 NOTE — Progress Notes (Signed)
PROCEDURE NOTE:  The patient requests injections of the right knee , verbal consent was obtained.  The right knee was prepped appropriately after time out was performed.   Sterile technique was observed and injection of 1 cc of Depo-Medrol 40 mg with several cc's of plain xylocaine. Anesthesia was provided by ethyl chloride and a 20-gauge needle was used to inject the knee area. The injection was tolerated well.  A band aid dressing was applied.  The patient was advised to apply ice later today and tomorrow to the injection sight as needed.  I have also called in naprosyn 500 po bid pc.  I will see as needed.  Electronically Signed Darreld Mclean, MD 4/14/20219:23 AM

## 2019-10-23 ENCOUNTER — Ambulatory Visit: Payer: Medicare PPO | Admitting: Orthopaedic Surgery

## 2019-10-27 ENCOUNTER — Other Ambulatory Visit: Payer: Self-pay | Admitting: Orthopaedic Surgery

## 2019-11-04 ENCOUNTER — Other Ambulatory Visit: Payer: Self-pay

## 2019-11-04 ENCOUNTER — Encounter: Payer: Self-pay | Admitting: Orthopaedic Surgery

## 2019-11-04 ENCOUNTER — Ambulatory Visit: Payer: Medicare PPO | Admitting: Orthopaedic Surgery

## 2019-11-04 VITALS — BP 163/68 | HR 69 | Ht 64.0 in | Wt 262.0 lb

## 2019-11-04 DIAGNOSIS — M25569 Pain in unspecified knee: Secondary | ICD-10-CM | POA: Insufficient documentation

## 2019-11-04 DIAGNOSIS — Z6841 Body Mass Index (BMI) 40.0 and over, adult: Secondary | ICD-10-CM

## 2019-11-04 DIAGNOSIS — M25562 Pain in left knee: Secondary | ICD-10-CM | POA: Diagnosis not present

## 2019-11-04 DIAGNOSIS — G8929 Other chronic pain: Secondary | ICD-10-CM | POA: Diagnosis not present

## 2019-11-04 NOTE — Progress Notes (Signed)
Patient Stacy [name], female DOB:[birth year], 64 y.o. [id number]  Chief Complaint  Patient presents with  . Knee Pain    right knee / better after injection now has left knee painful     HPI  Stacy [name] is a 64 y.o. female who has developed pain in the left knee.  I had seen her for the right knee in the past. She has swelling, popping but no giving way. She has no trauma, no redness.  She requests injection to the left knee.   Body mass index is 44.97 kg/m.  The patient meets the AMA guidelines for Morbid (severe) obesity with a BMI > 40.0 and I have recommended weight loss.   ROS  Review of Systems  Constitutional: Positive for activity change.  HENT: Negative for congestion.   Respiratory: Negative for cough and shortness of breath.   Cardiovascular: Negative for chest pain and leg swelling.  Endocrine: Positive for cold intolerance.  Musculoskeletal: Positive for arthralgias, back pain, gait problem and joint swelling.  Allergic/Immunologic: Positive for environmental allergies.  All other systems reviewed and are negative.   All other systems reviewed and are negative.  The following is a summary of the past history medically, past history surgically, known current medicines, social history and family history.  This information is gathered electronically by the computer from prior information and documentation.  I review this each visit and have found including this information at this point in the chart is beneficial and informative.    Past Medical History:  Diagnosis Date  . Arthritis   . Hypertension     Past Surgical History:  Procedure Laterality Date  . TUBAL LIGATION      Family History  Problem Relation Age of Onset  . COPD Mother   . Arthritis Mother   . Heart attack Mother   . Cancer Father   . Hypertension Father   . Arthritis Sister   . Hypertension Sister   . Arthritis Brother   . Hypertension Brother     Social History Social  History   Tobacco Use  . Smoking status: Never Smoker  . Smokeless tobacco: Never Used  Substance Use Topics  . Alcohol use: Not on file  . Drug use: Not on file    No Known Allergies  Current Outpatient Medications  Medication Sig Dispense Refill  . atorvastatin (LIPITOR) 20 MG tablet Take 20 mg by mouth daily.    . diclofenac (VOLTAREN) 75 MG EC tablet TAKE 1 TABLET BY MOUTH TWICE DAILY WITH A MEAL 60 tablet 2  . hydrochlorothiazide (HYDRODIURIL) 12.5 MG tablet Take 12.5 mg by mouth daily.    Marland Kitchen HYDROcodone-acetaminophen (NORCO/VICODIN) 5-325 MG tablet TAKE 1 TABLET BY MOUTH EVERY 6 HOURS AS NEEDED FOR PAIN. MUST LAST 30 DAYS 56 tablet 0  . losartan (COZAAR) 50 MG tablet Take 50 mg by mouth daily.    . naproxen (NAPROSYN) 500 MG tablet Take 1 tablet (500 mg total) by mouth 2 (two) times daily with a meal. 60 tablet 5   No current facility-administered medications for this visit.     Physical Exam  Blood pressure (!) 163/68, pulse 69, height 5\' 4"  (1.626 m), weight 262 lb (118.8 kg).  Constitutional: overall normal hygiene, normal nutrition, well developed, normal grooming, normal body habitus. Assistive device:none  Musculoskeletal: gait and station Limp left, muscle tone and strength are normal, no tremors or atrophy is present.  .  Neurological: coordination overall normal.  Deep tendon reflex/nerve stretch intact.  Sensation normal.  Cranial nerves II-XII intact.   Skin:   Normal overall no scars, lesions, ulcers or rashes. No psoriasis.  Psychiatric: Alert and oriented x 3.  Recent memory intact, remote memory unclear.  Normal mood and affect. Well groomed.  Good eye contact.  Cardiovascular: overall no swelling, no varicosities, no edema bilaterally, normal temperatures of the legs and arms, no clubbing, cyanosis and good capillary refill.  Lymphatic: palpation is normal.  Left knee has effusion, crepitus, ROM 0 to 105,limp left, stable knee, NV intact.  All other  systems reviewed and are negative   The patient has been educated about the nature of the problem(s) and counseled on treatment options.  The patient appeared to understand what I have discussed and is in agreement with it.  Encounter Diagnoses  Name Primary?  . Chronic pain of left knee Yes  . Body mass index 40.0-44.9, adult (HCC)   . Morbid obesity (HCC)    PROCEDURE NOTE:  The patient requests injections of the left knee , verbal consent was obtained.  The left knee was prepped appropriately after time out was performed.   Sterile technique was observed and injection of 1 cc of Depo-Medrol 40 mg with several cc's of plain xylocaine. Anesthesia was provided by ethyl chloride and a 20-gauge needle was used to inject the knee area. The injection was tolerated well.  A band aid dressing was applied.  The patient was advised to apply ice later today and tomorrow to the injection sight as needed.   PLAN Call if any problems.  Precautions discussed.  Continue current medications.   Return to clinic as needed.   Electronically Signed Darreld Mclean, MD 5/11/20219:35 AM

## 2019-11-12 ENCOUNTER — Encounter: Payer: Self-pay | Admitting: Orthopedic Surgery

## 2019-11-17 ENCOUNTER — Telehealth: Payer: Self-pay

## 2019-11-17 NOTE — Telephone Encounter (Signed)
Patient called to see if she could get an injection in her right shoulder. She was here on 5/11 and got an injection in her knee so she was concerned if it would be too early for an injection and if it would cause a problem. I told her that I would send you a message and when I get my answer I will be glad to call her and get her on the schedule.  Please advise

## 2019-11-18 NOTE — Telephone Encounter (Signed)
Set her up for this injection.

## 2019-11-19 ENCOUNTER — Ambulatory Visit (INDEPENDENT_AMBULATORY_CARE_PROVIDER_SITE_OTHER): Payer: Medicare PPO | Admitting: Orthopaedic Surgery

## 2019-11-19 ENCOUNTER — Other Ambulatory Visit: Payer: Self-pay

## 2019-11-19 ENCOUNTER — Encounter: Payer: Self-pay | Admitting: Orthopaedic Surgery

## 2019-11-19 VITALS — Ht 63.0 in | Wt 265.0 lb

## 2019-11-19 DIAGNOSIS — G8929 Other chronic pain: Secondary | ICD-10-CM

## 2019-11-19 DIAGNOSIS — M25511 Pain in right shoulder: Secondary | ICD-10-CM | POA: Diagnosis not present

## 2019-11-19 MED ORDER — HYDROCODONE-ACETAMINOPHEN 5-325 MG PO TABS: ORAL_TABLET | ORAL | 0 refills | Status: DC

## 2019-11-19 NOTE — Progress Notes (Signed)
PROCEDURE NOTE:  The patient request injection, verbal consent was obtained.  The right shoulder was prepped appropriately after time out was performed.   Sterile technique was observed and injection of 1 cc of Depo-Medrol 40 mg with several cc's of plain xylocaine. Anesthesia was provided by ethyl chloride and a 20-gauge needle was used to inject the shoulder area. A posterior approach was used.  The injection was tolerated well.  A band aid dressing was applied.  The patient was advised to apply ice later today and tomorrow to the injection sight as needed.  I have reviewed the [place] Controlled Substance Reporting System web site prior to prescribing narcotic medicine for this patient.    See as needed.  Electronically Signed Darreld Mclean, MD 5/26/202110:05 AM

## 2019-12-11 ENCOUNTER — Telehealth: Payer: Self-pay | Admitting: Orthopaedic Surgery

## 2019-12-11 NOTE — Telephone Encounter (Signed)
Patient called, initially asking if Dr Hilda Lias can call in something for pain for her back. Relayed that Dr Hilda Lias is out of clinic this afternoon through Tuesday morning. Also offered appointment for a follow up visit on her back, as notes appear that Dr has not seen for this problem since 04/01/19. Patient asked to speak with Dr Sanjuan Dame nurse or assistant.

## 2019-12-17 ENCOUNTER — Telehealth: Payer: Self-pay | Admitting: Orthopaedic Surgery

## 2019-12-17 NOTE — Telephone Encounter (Signed)
Patient requests refill on Hydrocodone/Acetaminohen 5-325  Mgs.  Qty  56  Sig: TAKE 1 TABLET BY MOUTH EVERY 6 HOURS AS NEEDED FOR PAIN. MUST LAST 30 DAYS  Patient states she uses [facility]

## 2019-12-18 MED ORDER — HYDROCODONE-ACETAMINOPHEN 5-325 MG PO TABS: ORAL_TABLET | ORAL | 0 refills | Status: DC

## 2019-12-23 ENCOUNTER — Encounter: Payer: Self-pay | Admitting: Orthopaedic Surgery

## 2019-12-23 ENCOUNTER — Other Ambulatory Visit: Payer: Self-pay

## 2019-12-23 ENCOUNTER — Ambulatory Visit: Payer: Medicare PPO | Admitting: Orthopaedic Surgery

## 2019-12-23 DIAGNOSIS — M25561 Pain in right knee: Secondary | ICD-10-CM

## 2019-12-23 DIAGNOSIS — M25562 Pain in left knee: Secondary | ICD-10-CM | POA: Diagnosis not present

## 2019-12-23 DIAGNOSIS — G8929 Other chronic pain: Secondary | ICD-10-CM

## 2019-12-23 NOTE — Progress Notes (Signed)
PROCEDURE NOTE:  The patient requests injections of the left knee , verbal consent was obtained.  The left knee was prepped appropriately after time out was performed.   Sterile technique was observed and injection of 1 cc of Depo-Medrol 40 mg with several cc's of plain xylocaine. Anesthesia was provided by ethyl chloride and a 20-gauge needle was used to inject the knee area. The injection was tolerated well.  A band aid dressing was applied.  The patient was advised to apply ice later today and tomorrow to the injection sight as needed.  PROCEDURE NOTE:  The patient requests injections of the right knee , verbal consent was obtained.  The right knee was prepped appropriately after time out was performed.   Sterile technique was observed and injection of 1 cc of Depo-Medrol 40 mg with several cc's of plain xylocaine. Anesthesia was provided by ethyl chloride and a 20-gauge needle was used to inject the knee area. The injection was tolerated well.  A band aid dressing was applied.  The patient was advised to apply ice later today and tomorrow to the injection sight as needed.  Return in one month.  Electronically Signed Darreld Mclean, MD 6/29/202110:27 AM

## 2020-01-15 ENCOUNTER — Other Ambulatory Visit: Payer: Self-pay

## 2020-01-15 ENCOUNTER — Encounter: Payer: Self-pay | Admitting: Orthopaedic Surgery

## 2020-01-15 ENCOUNTER — Ambulatory Visit: Payer: Medicare PPO | Admitting: Orthopaedic Surgery

## 2020-01-15 DIAGNOSIS — G8929 Other chronic pain: Secondary | ICD-10-CM | POA: Diagnosis not present

## 2020-01-15 DIAGNOSIS — M25561 Pain in right knee: Secondary | ICD-10-CM | POA: Diagnosis not present

## 2020-01-15 DIAGNOSIS — M25562 Pain in left knee: Secondary | ICD-10-CM | POA: Diagnosis not present

## 2020-01-15 MED ORDER — HYDROCODONE-ACETAMINOPHEN 5-325 MG PO TABS: ORAL_TABLET | ORAL | 0 refills | Status: DC

## 2020-01-15 NOTE — Progress Notes (Signed)
PROCEDURE NOTE:  The patient requests injections of the left knee , verbal consent was obtained.  The left knee was prepped appropriately after time out was performed.   Sterile technique was observed and injection of 1 cc of Depo-Medrol 40 mg with several cc's of plain xylocaine. Anesthesia was provided by ethyl chloride and a 20-gauge needle was used to inject the knee area. The injection was tolerated well.  A band aid dressing was applied.  The patient was advised to apply ice later today and tomorrow to the injection sight as needed.  PROCEDURE NOTE:  The patient requests injections of the right knee , verbal consent was obtained.  The right knee was prepped appropriately after time out was performed.   Sterile technique was observed and injection of 1 cc of Depo-Medrol 40 mg with several cc's of plain xylocaine. Anesthesia was provided by ethyl chloride and a 20-gauge needle was used to inject the knee area. The injection was tolerated well.  A band aid dressing was applied.  The patient was advised to apply ice later today and tomorrow to the injection sight as needed.  Return in one month.  Electronically Signed Darreld Mclean, MD 7/22/202110:24 AM

## 2020-02-12 ENCOUNTER — Ambulatory Visit: Payer: Medicare PPO | Admitting: Orthopaedic Surgery

## 2020-02-12 ENCOUNTER — Other Ambulatory Visit: Payer: Self-pay

## 2020-02-12 ENCOUNTER — Encounter: Payer: Self-pay | Admitting: Orthopaedic Surgery

## 2020-02-12 DIAGNOSIS — G8929 Other chronic pain: Secondary | ICD-10-CM | POA: Diagnosis not present

## 2020-02-12 DIAGNOSIS — M25511 Pain in right shoulder: Secondary | ICD-10-CM

## 2020-02-12 DIAGNOSIS — M25562 Pain in left knee: Secondary | ICD-10-CM | POA: Diagnosis not present

## 2020-02-12 NOTE — Progress Notes (Signed)
PROCEDURE NOTE:  The patient requests injections of the left knee , verbal consent was obtained.  The left knee was prepped appropriately after time out was performed.   Sterile technique was observed and injection of 1 cc of Depo-Medrol 40 mg with several cc's of plain xylocaine. Anesthesia was provided by ethyl chloride and a 20-gauge needle was used to inject the knee area. The injection was tolerated well.  A band aid dressing was applied.  The patient was advised to apply ice later today and tomorrow to the injection sight as needed.  PROCEDURE NOTE:  The patient request injection, verbal consent was obtained.  The right shoulder was prepped appropriately after time out was performed.   Sterile technique was observed and injection of 1 cc of Depo-Medrol 40 mg with several cc's of plain xylocaine. Anesthesia was provided by ethyl chloride and a 20-gauge needle was used to inject the shoulder area. A posterior approach was used.  The injection was tolerated well.  A band aid dressing was applied.  The patient was advised to apply ice later today and tomorrow to the injection sight as needed.   See as needed.  Call if any problem.  Precautions discussed.   Electronically Signed Darreld Mclean, MD 8/19/20219:42 AM

## 2020-02-18 ENCOUNTER — Other Ambulatory Visit: Payer: Self-pay | Admitting: Orthopedic Surgery

## 2020-02-18 NOTE — Telephone Encounter (Signed)
Patient called in and wanted a refill of her Hydrocodone filled. It was last filled on 01/15/2020.   Patient takes TAKE 1 TABLET BY MOUTH EVERY 6 HOURS AS NEEDED FOR PAIN.   [facility] uses [facility]. Please advise.

## 2020-02-19 MED ORDER — HYDROCODONE-ACETAMINOPHEN 5-325 MG PO TABS: ORAL_TABLET | ORAL | 0 refills | Status: DC

## 2020-03-09 ENCOUNTER — Ambulatory Visit (INDEPENDENT_AMBULATORY_CARE_PROVIDER_SITE_OTHER): Payer: Medicare PPO | Admitting: Orthopaedic Surgery

## 2020-03-09 ENCOUNTER — Other Ambulatory Visit: Payer: Self-pay

## 2020-03-09 ENCOUNTER — Ambulatory Visit: Payer: Medicare PPO

## 2020-03-09 ENCOUNTER — Encounter: Payer: Self-pay | Admitting: Orthopaedic Surgery

## 2020-03-09 ENCOUNTER — Other Ambulatory Visit: Payer: Self-pay | Admitting: Orthopaedic Surgery

## 2020-03-09 VITALS — BP 157/79 | HR 63 | Ht 63.0 in | Wt 269.0 lb

## 2020-03-09 DIAGNOSIS — G8929 Other chronic pain: Secondary | ICD-10-CM

## 2020-03-09 DIAGNOSIS — M545 Low back pain, unspecified: Secondary | ICD-10-CM

## 2020-03-09 DIAGNOSIS — Z6841 Body Mass Index (BMI) 40.0 and over, adult: Secondary | ICD-10-CM

## 2020-03-09 DIAGNOSIS — M5440 Lumbago with sciatica, unspecified side: Secondary | ICD-10-CM

## 2020-03-09 MED ORDER — PREDNISONE 5 MG (21) PO TBPK
ORAL_TABLET | ORAL | 0 refills | Status: DC
Start: 2020-03-09 — End: 2020-05-04

## 2020-03-09 NOTE — Progress Notes (Signed)
Patient Stacy [name], female DOB:1956/03/02, 64 y.o. DTO:[id number]  Chief Complaint  Patient presents with  . Back Pain    LBP going to right leg     HPI  Stacy [name] is a 64 y.o. female who has recurrent lower back pain.  She has been seen for this before.  Over the last few weeks she has had burning pain in the middle of the back going to both buttocks.  She has tightness.  She cannot sleep well.  She has no numbness. She has no trauma.  Rest and heat,ice have not helped. She has pain medicine.   Body mass index is 47.65 kg/m.  The patient meets the AMA guidelines for Morbid (severe) obesity with a BMI > 40.0 and I have recommended weight loss.   ROS  Review of Systems  Constitutional: Positive for activity change.  HENT: Negative for congestion.   Respiratory: Negative for cough and shortness of breath.   Cardiovascular: Negative for chest pain and leg swelling.  Endocrine: Positive for cold intolerance.  Musculoskeletal: Positive for arthralgias, back pain, gait problem and joint swelling.  Allergic/Immunologic: Positive for environmental allergies.  All other systems reviewed and are negative.   All other systems reviewed and are negative.  The following is a summary of the past history medically, past history surgically, known current medicines, social history and family history.  This information is gathered electronically by the computer from prior information and documentation.  I review this each visit and have found including this information at this point in the chart is beneficial and informative.    Past Medical History:  Diagnosis Date  . Arthritis   . Hypertension     Past Surgical History:  Procedure Laterality Date  . TUBAL LIGATION      Family History  Problem Relation Age of Onset  . COPD Mother   . Arthritis Mother   . Heart attack Mother   . Cancer Father   . Hypertension Father   . Arthritis Sister   . Hypertension Sister   .  Arthritis Brother   . Hypertension Brother     Social History Social History   Tobacco Use  . Smoking status: Never Smoker  . Smokeless tobacco: Never Used  Substance Use Topics  . Alcohol use: Not on file  . Drug use: Not on file    No Known Allergies  Current Outpatient Medications  Medication Sig Dispense Refill  . atorvastatin (LIPITOR) 20 MG tablet Take 20 mg by mouth daily.    . diclofenac (VOLTAREN) 75 MG EC tablet TAKE 1 TABLET BY MOUTH TWICE DAILY WITH A MEAL 60 tablet 2  . hydrochlorothiazide (HYDRODIURIL) 12.5 MG tablet Take 12.5 mg by mouth daily.    Marland Kitchen HYDROcodone-acetaminophen (NORCO/VICODIN) 5-325 MG tablet TAKE 1 TABLET BY MOUTH EVERY 6 HOURS AS NEEDED FOR PAIN. MUST LAST 30 DAYS 50 tablet 0  . losartan (COZAAR) 50 MG tablet Take 50 mg by mouth daily.    . naproxen (NAPROSYN) 500 MG tablet Take 1 tablet (500 mg total) by mouth 2 (two) times daily with a meal. 60 tablet 5   No current facility-administered medications for this visit.     Physical Exam  Blood pressure (!) 157/79, pulse 63, height 5\' 3"  (1.6 m), weight 269 lb (122 kg).  Constitutional: overall normal hygiene, normal nutrition, well developed, normal grooming, normal body habitus. Assistive device:none  Musculoskeletal: gait and station Limp none, muscle tone and strength are normal, no tremors or atrophy  is present.  .  Neurological: coordination overall normal.  Deep tendon reflex/nerve stretch intact.  Sensation normal.  Cranial nerves II-XII intact.   Skin:   Normal overall no scars, lesions, ulcers or rashes. No psoriasis.  Psychiatric: Alert and oriented x 3.  Recent memory intact, remote memory unclear.  Normal mood and affect. Well groomed.  Good eye contact.  Cardiovascular: overall no swelling, no varicosities, no edema bilaterally, normal temperatures of the legs and arms, no clubbing, cyanosis and good capillary refill.  Lymphatic: palpation is normal.  Spine/Pelvis  examination:  Inspection:  Overall, sacoiliac joint benign and hips nontender; without crepitus or defects.   Thoracic spine inspection: Alignment normal without kyphosis present   Lumbar spine inspection:  Alignment  with normal lumbar lordosis, without scoliosis apparent.   Thoracic spine palpation:  without tenderness of spinal processes   Lumbar spine palpation: without tenderness of lumbar area; without tightness of lumbar muscles    Range of Motion:   Lumbar flexion, forward flexion is normal without pain or tenderness    Lumbar extension is full without pain or tenderness   Left lateral bend is normal without pain or tenderness   Right lateral bend is normal without pain or tenderness   Straight leg raising is normal  Strength & tone: normal   Stability overall normal stability  All other systems reviewed and are negative   The patient has been educated about the nature of the problem(s) and counseled on treatment options.  The patient appeared to understand what I have discussed and is in agreement with it.  Encounter Diagnoses  Name Primary?  . Chronic midline low back pain with sciatica, sciatica laterality unspecified Yes  . Body mass index 40.0-44.9, adult (HCC)   . Morbid obesity (HCC)    X-rays were done of the lower back, reported separately.  PLAN Call if any problems.  Precautions discussed.  Continue current medications. I will begin Prednisone dose pack.  Get MRI of the lumbar spine.  Return to clinic 2 weeks   Electronically Signed Darreld Mclean, MD 9/14/20213:23 PM

## 2020-03-17 ENCOUNTER — Other Ambulatory Visit: Payer: Self-pay

## 2020-03-17 ENCOUNTER — Ambulatory Visit (HOSPITAL_COMMUNITY)
Admission: RE | Admit: 2020-03-17 | Discharge: 2020-03-17 | Disposition: A | Discharge status: Home / Self Care | Payer: Medicare PPO | Source: Ambulatory Visit | Attending: Orthopaedic Surgery | Admitting: Orthopaedic Surgery

## 2020-03-17 DIAGNOSIS — G8929 Other chronic pain: Secondary | ICD-10-CM | POA: Diagnosis present

## 2020-03-17 DIAGNOSIS — M5440 Lumbago with sciatica, unspecified side: Secondary | ICD-10-CM | POA: Diagnosis not present

## 2020-03-17 IMAGING — MR MR LUMBAR SPINE W/O CM
4 of 5 series · 30 of 48 positions shown · non-contrast
Comparison: Radiographs of the lumbar spine [DATE]. Lumbar
spine MRI [DATE].

CLINICAL DATA: Chronic midline low back pain with sciatica,
sciatica laterality unspecified. Lumbar radiculopathy, greater than
6 weeks. Additional history provided: Patient reports back pain,
muscle spasms, bilateral leg pain.

EXAM:
MRI LUMBAR SPINE WITHOUT CONTRAST
TECHNIQUE: Multiplanar, multisequence MR imaging of the lumbar spine was
performed. No intravenous contrast was administered.

[Series 5: T2 · sagittal · 4.0mm · 0.68mm/px · 7 of 15 slices shown (1 of 2)]
[im 1/15]
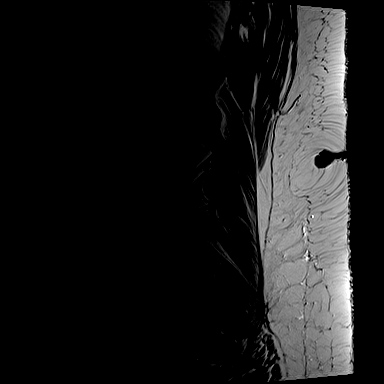
[im 3/15]
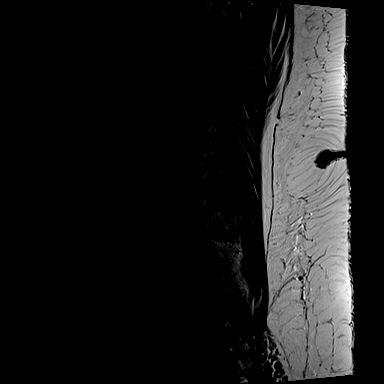
[im 5/15]
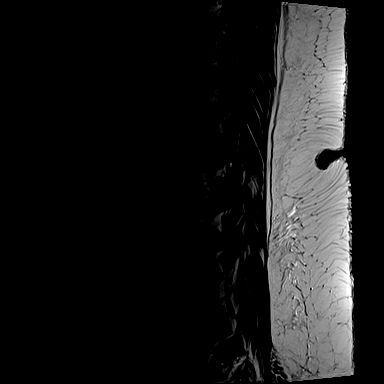
[im 8/15]
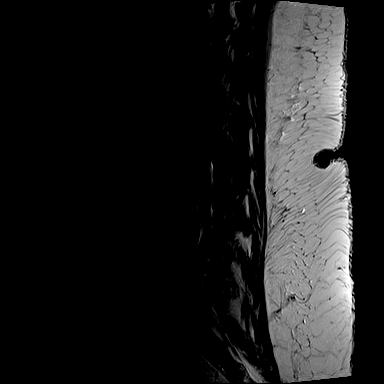
[im 10/15]
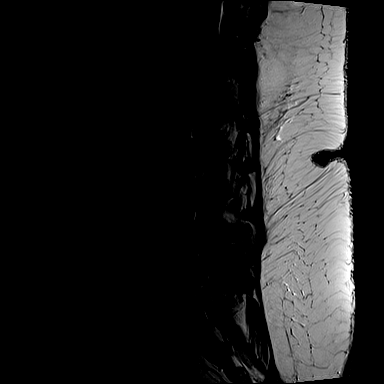
[im 12/15]
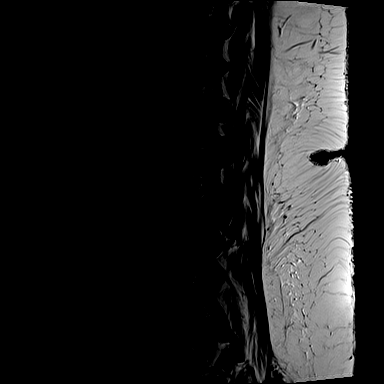
[im 15/15]
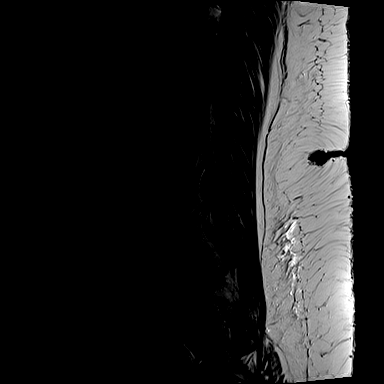

[Series 6: T1 · sagittal · 4.0mm · 0.81mm/px · 7 of 15 slices shown (1 of 2)]
[im 1/15]
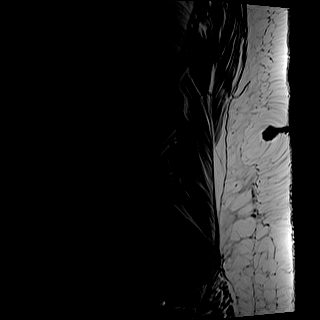
[im 3/15]
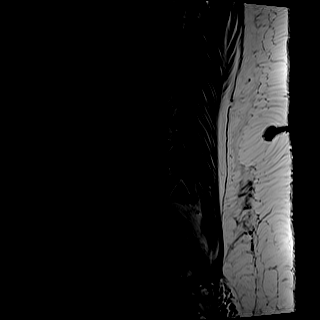
[im 5/15]
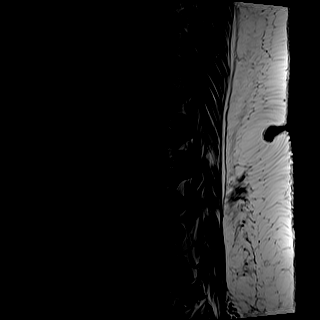
[im 8/15]
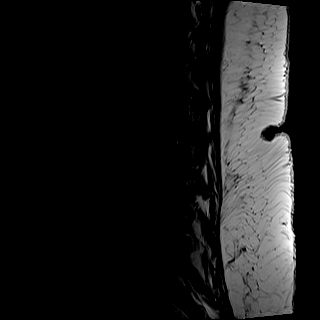
[im 10/15]
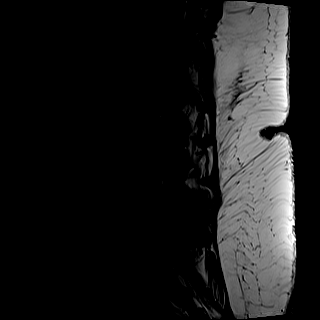
[im 12/15]
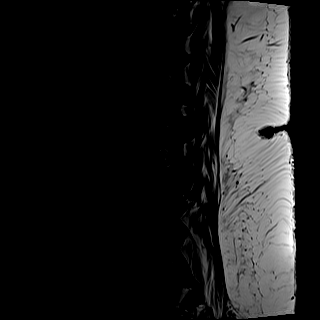
[im 15/15]
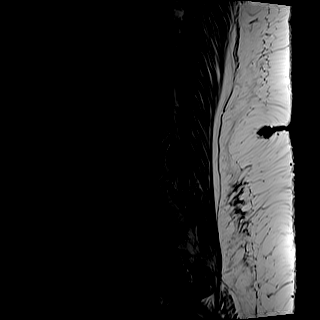

[Series 8: T2 · axial · 4.0mm · 0.70mm/px · z∈[-162,+3]mm · 8 of 34 slices shown (2 of 2)]
[im 1/34]
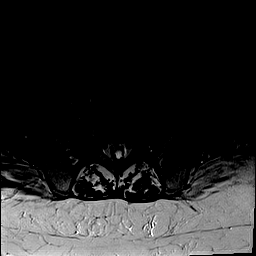
[im 6/34]
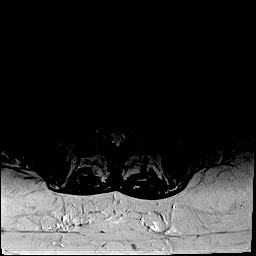
[im 11/34]
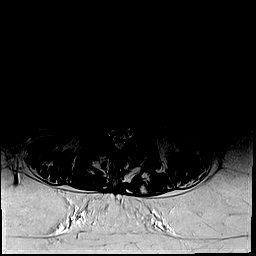
[im 16/34]
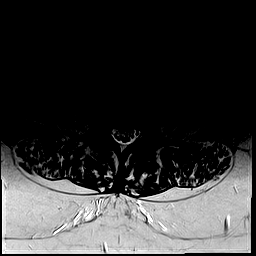
[im 18/34]
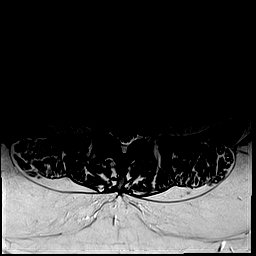
[im 23/34]
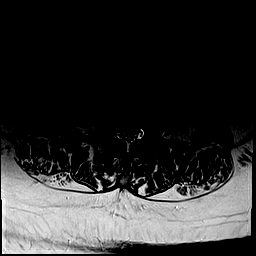
[im 28/34]
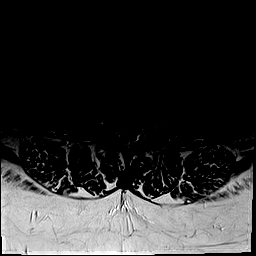
[im 34/34]
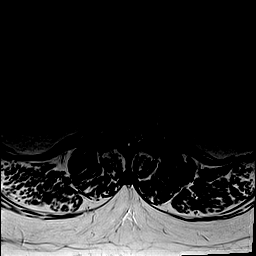

[Series 9: T1 · axial · 4.0mm · 0.35mm/px · z∈[-162,+3]mm · 8 of 34 slices shown (2 of 2)]
[im 1/34]
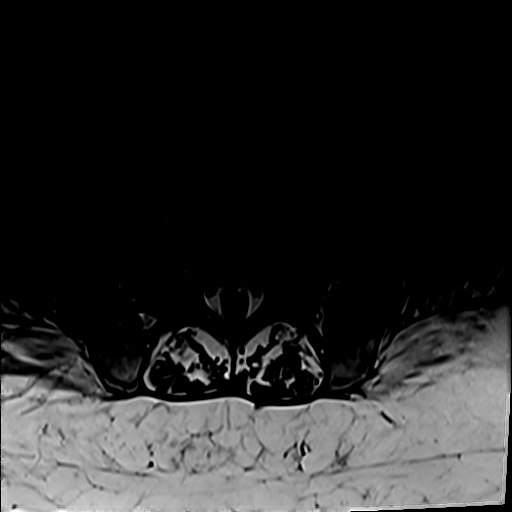
[im 6/34]
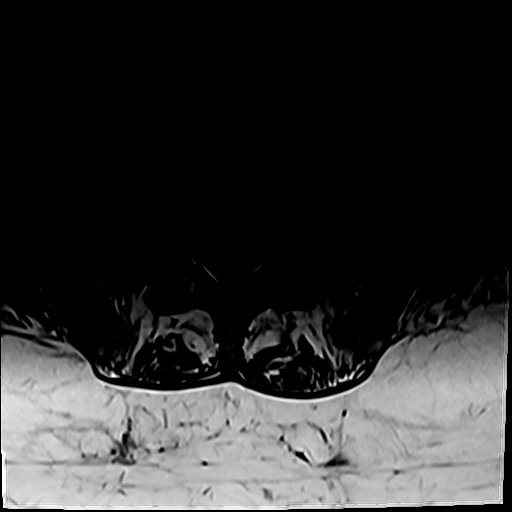
[im 11/34]
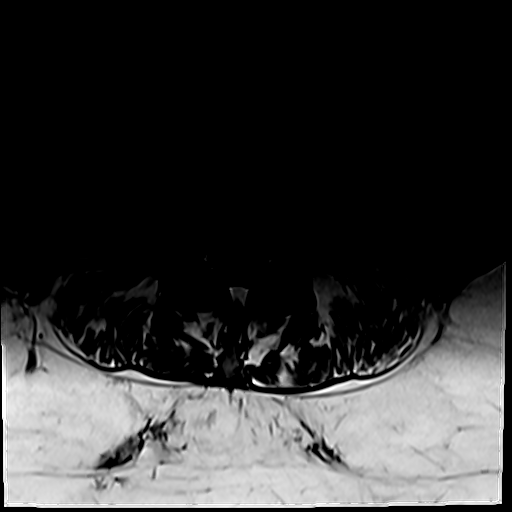
[im 16/34]
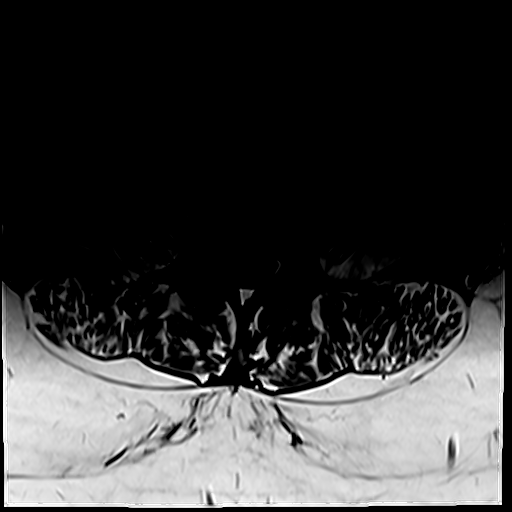
[im 18/34]
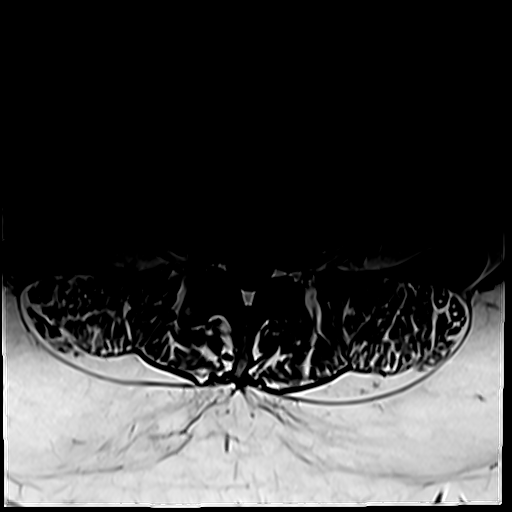
[im 23/34]
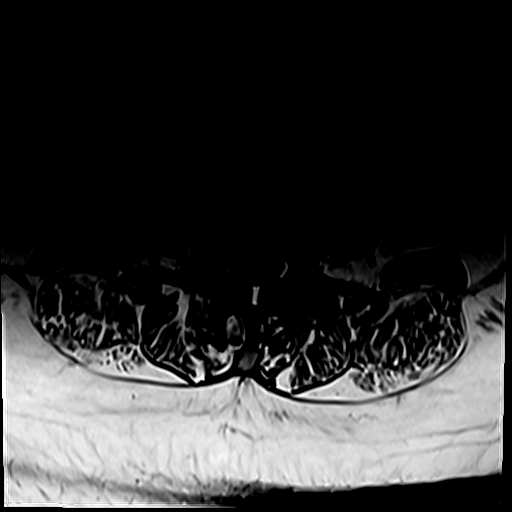
[im 28/34]
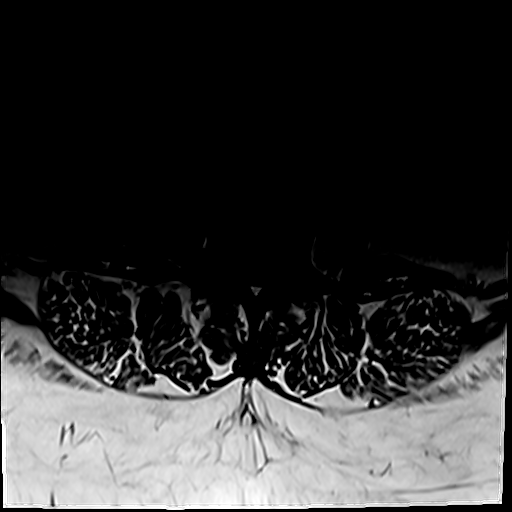
[im 34/34]
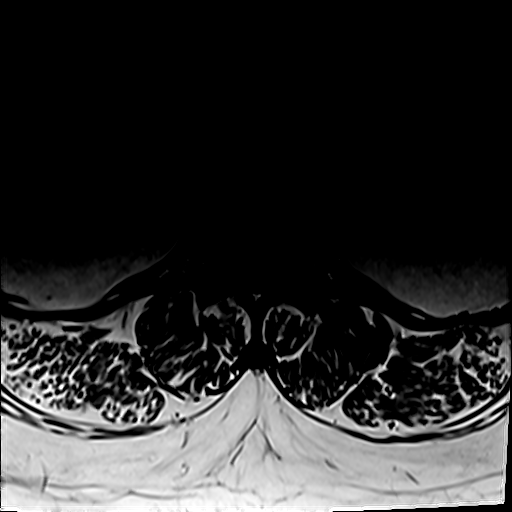

[30 of 48 positions shown; findings below may reference images not displayed]

FINDINGS: Segmentation:  5 lumbar vertebrae.

Alignment: Straightening of the expected lumbar lordosis. Mild L2-L3
and L3-L4 grade 1 anterolisthesis. 3 mm L5-S1 grade 1
retrolisthesis.

Vertebrae: Vertebral body height is maintained. No significant
marrow edema or suspicious osseous lesion. L1 vertebral body
hemangioma. Multilevel degenerative endplate irregularity with small
Schmorl nodes.

Conus medullaris and cauda equina: Conus extends to the L1-L2 level.
No signal abnormality within the visualized distal spinal cord.

Paraspinal and other soft tissues: No abnormality identified within
included portions of the abdomen/retroperitoneum. Atrophy of the
lumbar paraspinal musculature.

Disc levels:

Unless otherwise stated, the level by level findings below have not
significantly changed since prior MRI [DATE].

Moderate disc degeneration is present at the T12-L1 through L2-L3
levels. Moderately advanced disc degeneration is present at L3-L4,
L4-L5 and L5-S1.

T11-T12: This level is imaged sagittally. Disc bulge with endplate
spurring. Superimposed small central disc protrusion. Mild relative
narrowing of the spinal canal. No significant foraminal stenosis

T12-L1: This level is imaged sagittally. Disc bulge with endplate
spurring. Superimposed small central disc protrusion. Mild relative
spinal canal narrowing. No significant foraminal stenosis

L1-L2: Disc bulge with endplate spurring. Superimposed broad-based
central disc protrusion. Mild facet arthrosis. Mild relative spinal
canal stenosis. No significant foraminal narrowing.

L2-L3: Mild grade 1 anterolisthesis. Moderate facet arthrosis with
mild ligamentum flavum hypertrophy. No significant spinal canal
stenosis. Borderline mild right neural foraminal narrowing.

L3-L4: Grade 1 anterolisthesis. Disc uncovering with disc bulge.
Moderate facet arthrosis with mild ligamentum flavum hypertrophy.
Mild relative spinal canal narrowing. No significant foraminal
stenosis.

L4-L5: Disc bulge asymmetric to the right with associated endplate
spurring. Superimposed shallow broad-based right
subarticular/foraminal disc protrusion. Mild facet
arthrosis/ligamentum flavum hypertrophy. Mild right subarticular
narrowing without nerve root impingement. Central canal patent. Mild
bilateral neural foraminal narrowing.

L5-S1: Grade 1 retrolisthesis. Disc bulge with endplate spurring.
Mild facet arthrosis. No significant spinal canal stenosis.
Bilateral neural foraminal narrowing (mild right, moderate left.
Additionally, disc osteophyte ridge may contact the exiting left L5
nerve root beyond the left neural foramen.
IMPRESSION: Lumbar spondylosis as outlined and not significantly changed as
compared to the MRI of [DATE].

No more than mild spinal canal stenosis at any level.

At L5-S1, there is multifactorial moderate left neural foraminal
narrowing. Additionally, disc osteophyte ridge may contact the
exiting left L5 nerve root beyond the left neural foramen.

Additional sites of mild neural foraminal narrowing as detailed.

Of note, disc degeneration is moderately advanced at the L3-L4,
L4-L5 and L5-S1 levels.

Mild multilevel grade 1 spondylolisthesis.

## 2020-03-23 ENCOUNTER — Encounter: Payer: Self-pay | Admitting: Orthopaedic Surgery

## 2020-03-23 ENCOUNTER — Ambulatory Visit: Payer: Medicare PPO | Admitting: Orthopaedic Surgery

## 2020-03-23 ENCOUNTER — Other Ambulatory Visit: Payer: Self-pay

## 2020-03-23 VITALS — BP 162/85 | HR 81 | Ht 63.0 in | Wt 269.0 lb

## 2020-03-23 DIAGNOSIS — M25562 Pain in left knee: Secondary | ICD-10-CM

## 2020-03-23 DIAGNOSIS — Z6841 Body Mass Index (BMI) 40.0 and over, adult: Secondary | ICD-10-CM

## 2020-03-23 DIAGNOSIS — G8929 Other chronic pain: Secondary | ICD-10-CM

## 2020-03-23 DIAGNOSIS — M5136 Other intervertebral disc degeneration, lumbar region: Secondary | ICD-10-CM | POA: Diagnosis not present

## 2020-03-23 DIAGNOSIS — M25561 Pain in right knee: Secondary | ICD-10-CM | POA: Diagnosis not present

## 2020-03-23 DIAGNOSIS — D649 Anemia, unspecified: Secondary | ICD-10-CM | POA: Insufficient documentation

## 2020-03-23 DIAGNOSIS — R7301 Impaired fasting glucose: Secondary | ICD-10-CM | POA: Insufficient documentation

## 2020-03-23 MED ORDER — HYDROCODONE-ACETAMINOPHEN 5-325 MG PO TABS
ORAL_TABLET | ORAL | 0 refills | Status: DC
Start: 2020-03-23 — End: 2020-04-23

## 2020-03-23 NOTE — Patient Instructions (Signed)
The neurosurgeon office will call with appointment if you call them it will be quicker. The phone number is [phone] you can call them to make appointment

## 2020-03-23 NOTE — Progress Notes (Signed)
Patient Stacy [name], female DOB:1955/09/06, 64 y.o. SEG:[id number]  Chief Complaint  Patient presents with  . Back Pain  . Results    review MRI     HPI  Kalisha Keadle is a 64 y.o. female who has lower back pain and bilateral knee pain.  She had MRI of the lumbar spine which showed: IMPRESSION: Lumbar spondylosis as outlined and not significantly changed as compared to the MRI of 12/10/2018.  No more than mild spinal canal stenosis at any level.  At L5-S1, there is multifactorial moderate left neural foraminal narrowing. Additionally, disc osteophyte ridge may contact the exiting left L5 nerve root beyond the left neural foramen.  Additional sites of mild neural foraminal narrowing as detailed.  Of note, disc degeneration is moderately advanced at the L3-L4, L4-L5 and L5-S1 levels.  Mild multilevel grade 1 spondylolisthesis.  She has left sided paresthesia that are not getting better.  I will have neurosurgeon see her.  I have independently reviewed the MRI.     She has bilateral knee pain and swelling.  She has no giving way.   Body mass index is 47.65 kg/m.  The patient meets the AMA guidelines for Morbid (severe) obesity with a BMI > 40.0 and I have recommended weight loss.   ROS  Review of Systems  Constitutional: Positive for activity change.  HENT: Negative for congestion.   Respiratory: Negative for cough and shortness of breath.   Cardiovascular: Negative for chest pain and leg swelling.  Endocrine: Positive for cold intolerance.  Musculoskeletal: Positive for arthralgias, back pain, gait problem and joint swelling.  Allergic/Immunologic: Positive for environmental allergies.  All other systems reviewed and are negative.   All other systems reviewed and are negative.  The following is a summary of the past history medically, past history surgically, known current medicines, social history and family history.  This information is gathered  electronically by the computer from prior information and documentation.  I review this each visit and have found including this information at this point in the chart is beneficial and informative.    Past Medical History:  Diagnosis Date  . Arthritis   . Hypertension     Past Surgical History:  Procedure Laterality Date  . TUBAL LIGATION      Family History  Problem Relation Age of Onset  . COPD Mother   . Arthritis Mother   . Heart attack Mother   . Cancer Father   . Hypertension Father   . Arthritis Sister   . Hypertension Sister   . Arthritis Brother   . Hypertension Brother     Social History Social History   Tobacco Use  . Smoking status: Never Smoker  . Smokeless tobacco: Never Used  Substance Use Topics  . Alcohol use: Not on file  . Drug use: Not on file    No Known Allergies  Current Outpatient Medications  Medication Sig Dispense Refill  . atorvastatin (LIPITOR) 20 MG tablet Take 20 mg by mouth daily.    . diclofenac (VOLTAREN) 75 MG EC tablet TAKE 1 TABLET BY MOUTH TWICE DAILY WITH A MEAL 60 tablet 2  . hydrochlorothiazide (HYDRODIURIL) 12.5 MG tablet Take 12.5 mg by mouth daily.    Marland Kitchen HYDROcodone-acetaminophen (NORCO/VICODIN) 5-325 MG tablet TAKE 1 TABLET BY MOUTH EVERY 6 HOURS AS NEEDED FOR PAIN. MUST LAST 30 DAYS 50 tablet 0  . losartan (COZAAR) 50 MG tablet Take 50 mg by mouth daily.    . naproxen (NAPROSYN) 500 MG tablet  Take 1 tablet (500 mg total) by mouth 2 (two) times daily with a meal. 60 tablet 5  . predniSONE (STERAPRED UNI-PAK 21 TAB) 5 MG (21) TBPK tablet Take 6 pills first day; 5 pills second day; 4 pills third day; 3 pills fourth day; 2 pills next day and 1 pill last day. 21 tablet 0   No current facility-administered medications for this visit.     Physical Exam  Blood pressure (!) 162/85, pulse 81, height 5\' 3"  (1.6 m), weight 269 lb (122 kg).  Constitutional: overall normal hygiene, normal nutrition, well developed, normal  grooming, normal body habitus. Assistive device:none  Musculoskeletal: gait and station Limp left, muscle tone and strength are normal, no tremors or atrophy is present.  .  Neurological: coordination overall normal.  Deep tendon reflex/nerve stretch intact.  Sensation normal.  Cranial nerves II-XII intact.   Skin:   Normal overall no scars, lesions, ulcers or rashes. No psoriasis.  Psychiatric: Alert and oriented x 3.  Recent memory intact, remote memory unclear.  Normal mood and affect. Well groomed.  Good eye contact.  Cardiovascular: overall no swelling, no varicosities, no edema bilaterally, normal temperatures of the legs and arms, no clubbing, cyanosis and good capillary refill.  Spine/Pelvis examination:  Inspection:  Overall, sacoiliac joint benign and hips nontender; without crepitus or defects.   Thoracic spine inspection: Alignment normal without kyphosis present   Lumbar spine inspection:  Alignment  with normal lumbar lordosis, without scoliosis apparent.   Thoracic spine palpation:  without tenderness of spinal processes   Lumbar spine palpation: without tenderness of lumbar area; without tightness of lumbar muscles    Range of Motion:   Lumbar flexion, forward flexion is normal without pain or tenderness    Lumbar extension is full without pain or tenderness   Left lateral bend is normal without pain or tenderness   Right lateral bend is normal without pain or tenderness   Straight leg raising is normal  Strength & tone: normal   Stability overall normal stability Lymphatic: palpation is normal.  Both knees are tender with swelling and pain.  She has limp left.  ROM left 0 to 105, right 0 to 110, crepitus is present.  Stable.  All other systems reviewed and are negative   The patient has been educated about the nature of the problem(s) and counseled on treatment options.  The patient appeared to understand what I have discussed and is in agreement with  it.  Encounter Diagnoses  Name Primary?  . DDD (degenerative disc disease), lumbar Yes  . Chronic pain of left knee   . Chronic pain in right shoulder   . Body mass index 45.0-49.9, adult (HCC)   . Morbid obesity (HCC)    PROCEDURE NOTE:  The patient requests injections of the right knee , verbal consent was obtained.  The right knee was prepped appropriately after time out was performed.   Sterile technique was observed and injection of 1 cc of Depo-Medrol 40 mg with several cc's of plain xylocaine. Anesthesia was provided by ethyl chloride and a 20-gauge needle was used to inject the knee area. The injection was tolerated well.  A band aid dressing was applied.  The patient was advised to apply ice later today and tomorrow to the injection sight as needed.  PROCEDURE NOTE:  The patient requests injections of the left knee , verbal consent was obtained.  The left knee was prepped appropriately after time out was performed.   Sterile  technique was observed and injection of 1 cc of Depo-Medrol 40 mg with several cc's of plain xylocaine. Anesthesia was provided by ethyl chloride and a 20-gauge needle was used to inject the knee area. The injection was tolerated well.  A band aid dressing was applied.  The patient was advised to apply ice later today and tomorrow to the injection sight as needed.   PLAN Call if any problems.  Precautions discussed.  Continue current medications.   Return to clinic 6 weeks   Neurosurgery evaluation.  I have reviewed the [place] Controlled Substance Reporting System web site prior to prescribing narcotic medicine for this patient.   Electronically Signed Darreld Mclean, MD 9/28/20219:40 AM

## 2020-03-25 ENCOUNTER — Ambulatory Visit: Payer: Medicare PPO | Admitting: Orthopaedic Surgery

## 2020-04-22 ENCOUNTER — Other Ambulatory Visit: Payer: Self-pay | Admitting: Orthopaedic Surgery

## 2020-04-28 ENCOUNTER — Other Ambulatory Visit: Payer: Self-pay | Admitting: Orthopaedic Surgery

## 2020-04-29 ENCOUNTER — Ambulatory Visit: Payer: Medicare PPO | Admitting: Orthopaedic Surgery

## 2020-05-04 ENCOUNTER — Other Ambulatory Visit: Payer: Self-pay

## 2020-05-04 ENCOUNTER — Encounter: Payer: Self-pay | Admitting: Orthopaedic Surgery

## 2020-05-04 ENCOUNTER — Ambulatory Visit: Payer: Medicare PPO | Admitting: Orthopaedic Surgery

## 2020-05-04 VITALS — BP 228/124 | HR 72 | Ht 63.5 in | Wt 270.0 lb

## 2020-05-04 DIAGNOSIS — M25561 Pain in right knee: Secondary | ICD-10-CM | POA: Diagnosis not present

## 2020-05-04 DIAGNOSIS — M25562 Pain in left knee: Secondary | ICD-10-CM | POA: Diagnosis not present

## 2020-05-04 DIAGNOSIS — Z6841 Body Mass Index (BMI) 40.0 and over, adult: Secondary | ICD-10-CM

## 2020-05-04 DIAGNOSIS — G8929 Other chronic pain: Secondary | ICD-10-CM | POA: Diagnosis not present

## 2020-05-04 MED ORDER — PREDNISONE 5 MG (21) PO TBPK: ORAL_TABLET | ORAL | 0 refills | Status: DC

## 2020-05-04 NOTE — Progress Notes (Signed)
She saw neurosurgeon but she declined epidurals.  I have reviewed the notes.  PROCEDURE NOTE:  The patient requests injections of the left knee , verbal consent was obtained.  The left knee was prepped appropriately after time out was performed.   Sterile technique was observed and injection of 1 cc of Depo-Medrol 40 mg with several cc's of plain xylocaine. Anesthesia was provided by ethyl chloride and a 20-gauge needle was used to inject the knee area. The injection was tolerated well.  A band aid dressing was applied.  The patient was advised to apply ice later today and tomorrow to the injection sight as needed.  PROCEDURE NOTE:  The patient requests injections of the right knee , verbal consent was obtained.  The right knee was prepped appropriately after time out was performed.   Sterile technique was observed and injection of 1 cc of Depo-Medrol 40 mg with several cc's of plain xylocaine. Anesthesia was provided by ethyl chloride and a 20-gauge needle was used to inject the knee area. The injection was tolerated well.  A band aid dressing was applied.  The patient was advised to apply ice later today and tomorrow to the injection sight as needed.  I will call in dose pack prednisone.  Return in six weeks.  Electronically Signed Darreld Mclean, MD 11/9/202110:35 AM'

## 2020-05-25 ENCOUNTER — Other Ambulatory Visit: Payer: Self-pay | Admitting: Orthopaedic Surgery

## 2020-05-26 NOTE — Telephone Encounter (Signed)
Ok to send to Dr Kirtland Bouchard

## 2020-06-08 ENCOUNTER — Ambulatory Visit (INDEPENDENT_AMBULATORY_CARE_PROVIDER_SITE_OTHER): Payer: Medicare PPO | Admitting: Orthopaedic Surgery

## 2020-06-08 ENCOUNTER — Telehealth: Payer: Self-pay | Admitting: Orthopaedic Surgery

## 2020-06-08 ENCOUNTER — Encounter: Payer: Self-pay | Admitting: Orthopaedic Surgery

## 2020-06-08 ENCOUNTER — Other Ambulatory Visit: Payer: Self-pay

## 2020-06-08 DIAGNOSIS — G8929 Other chronic pain: Secondary | ICD-10-CM | POA: Diagnosis not present

## 2020-06-08 DIAGNOSIS — M25561 Pain in right knee: Secondary | ICD-10-CM

## 2020-06-08 DIAGNOSIS — M25562 Pain in left knee: Secondary | ICD-10-CM

## 2020-06-08 MED ORDER — PREDNISONE 5 MG (21) PO TBPK
ORAL_TABLET | ORAL | 0 refills | Status: DC
Start: 2020-06-08 — End: 2020-07-13

## 2020-06-08 NOTE — Telephone Encounter (Signed)
Patient was seen earlier today.  She requested refill on Prednisone 5 mgs.  Sig: Take 6 pills first day; 5 pills second day; 4 pills third day; 3 pills fourth day; 2 pills next day and 1 pill last day.  Patient states she uses [facility]

## 2020-06-08 NOTE — Progress Notes (Signed)
PROCEDURE NOTE:  The patient requests injections of the right knee , verbal consent was obtained.  The right knee was prepped appropriately after time out was performed.   Sterile technique was observed and injection of 1 cc of Depo-Medrol 40 mg with several cc's of plain xylocaine. Anesthesia was provided by ethyl chloride and a 20-gauge needle was used to inject the knee area. The injection was tolerated well.  A band aid dressing was applied.  The patient was advised to apply ice later today and tomorrow to the injection sight as needed.  PROCEDURE NOTE:  The patient requests injections of the left knee , verbal consent was obtained.  The left knee was prepped appropriately after time out was performed.   Sterile technique was observed and injection of 1 cc of Depo-Medrol 40 mg with several cc's of plain xylocaine. Anesthesia was provided by ethyl chloride and a 20-gauge needle was used to inject the knee area. The injection was tolerated well.  A band aid dressing was applied.  The patient was advised to apply ice later today and tomorrow to the injection sight as needed.  Return in six weeks.  Electronically Signed Darreld Mclean, MD 12/14/20213:03 PM

## 2020-06-15 ENCOUNTER — Ambulatory Visit: Payer: Medicare PPO | Admitting: Orthopaedic Surgery

## 2020-06-30 ENCOUNTER — Telehealth: Payer: Self-pay | Admitting: Orthopaedic Surgery

## 2020-07-13 ENCOUNTER — Other Ambulatory Visit: Payer: Self-pay

## 2020-07-13 ENCOUNTER — Encounter: Payer: Self-pay | Admitting: Orthopaedic Surgery

## 2020-07-13 ENCOUNTER — Ambulatory Visit: Payer: Medicare PPO | Admitting: Orthopaedic Surgery

## 2020-07-13 DIAGNOSIS — G8929 Other chronic pain: Secondary | ICD-10-CM

## 2020-07-13 DIAGNOSIS — M25562 Pain in left knee: Secondary | ICD-10-CM

## 2020-07-13 DIAGNOSIS — Z6841 Body Mass Index (BMI) 40.0 and over, adult: Secondary | ICD-10-CM

## 2020-07-13 DIAGNOSIS — M25561 Pain in right knee: Secondary | ICD-10-CM

## 2020-07-13 MED ORDER — PREDNISONE 5 MG (21) PO TBPK: ORAL_TABLET | ORAL | 0 refills | Status: DC

## 2020-07-13 NOTE — Progress Notes (Signed)
PROCEDURE NOTE:  The patient requests injections of the right knee , verbal consent was obtained.  The right knee was prepped appropriately after time out was performed.   Sterile technique was observed and injection of 1 cc of Depo-Medrol 40 mg with several cc's of plain xylocaine. Anesthesia was provided by ethyl chloride and a 20-gauge needle was used to inject the knee area. The injection was tolerated well.  A band aid dressing was applied.  The patient was advised to apply ice later today and tomorrow to the injection sight as needed.  PROCEDURE NOTE:  The patient requests injections of the left knee , verbal consent was obtained.  The left knee was prepped appropriately after time out was performed.   Sterile technique was observed and injection of 1 cc of Depo-Medrol 40 mg with several cc's of plain xylocaine. Anesthesia was provided by ethyl chloride and a 20-gauge needle was used to inject the knee area. The injection was tolerated well.  A band aid dressing was applied.  The patient was advised to apply ice later today and tomorrow to the injection sight as needed.  Return in six weeks.  I have given prednisone dose pack for future use.  Call if any problem.  Precautions discussed.   Electronically Signed Darreld Mclean, MD 1/18/20222:49 PM

## 2020-07-20 ENCOUNTER — Ambulatory Visit: Payer: Medicare PPO | Admitting: Orthopaedic Surgery

## 2020-08-03 ENCOUNTER — Telehealth: Payer: Self-pay | Admitting: Orthopaedic Surgery

## 2020-08-05 NOTE — Telephone Encounter (Signed)
error 

## 2020-08-24 ENCOUNTER — Ambulatory Visit: Payer: Medicare PPO | Admitting: Orthopaedic Surgery

## 2020-08-24 ENCOUNTER — Other Ambulatory Visit: Payer: Self-pay

## 2020-08-24 ENCOUNTER — Encounter: Payer: Self-pay | Admitting: Orthopaedic Surgery

## 2020-08-24 DIAGNOSIS — Z6841 Body Mass Index (BMI) 40.0 and over, adult: Secondary | ICD-10-CM

## 2020-08-24 DIAGNOSIS — G8929 Other chronic pain: Secondary | ICD-10-CM

## 2020-08-24 DIAGNOSIS — M25561 Pain in right knee: Secondary | ICD-10-CM | POA: Diagnosis not present

## 2020-08-24 DIAGNOSIS — M25562 Pain in left knee: Secondary | ICD-10-CM | POA: Diagnosis not present

## 2020-08-24 MED ORDER — PREDNISONE 5 MG (21) PO TBPK: ORAL_TABLET | ORAL | 0 refills | Status: DC

## 2020-08-24 NOTE — Progress Notes (Signed)
PROCEDURE NOTE:  The patient requests injections of the left knee , verbal consent was obtained.  The left knee was prepped appropriately after time out was performed.   Sterile technique was observed and injection of 1 cc of Celestone 6 mg with several cc's of plain xylocaine. Anesthesia was provided by ethyl chloride and a 20-gauge needle was used to inject the knee area. The injection was tolerated well.  A band aid dressing was applied.  The patient was advised to apply ice later today and tomorrow to the injection sight as needed.  PROCEDURE NOTE:  The patient requests injections of the right knee , verbal consent was obtained.  The right knee was prepped appropriately after time out was performed.   Sterile technique was observed and injection of 1 cc of Celestone 6 mg with several cc's of plain xylocaine. Anesthesia was provided by ethyl chloride and a 20-gauge needle was used to inject the knee area. The injection was tolerated well.  A band aid dressing was applied.  The patient was advised to apply ice later today and tomorrow to the injection sight as needed.  Return in six weeks.  Call if any problem.  Precautions discussed.   Electronically Signed Darreld Mclean, MD 3/1/20222:04 PM

## 2020-08-31 ENCOUNTER — Telehealth: Payer: Self-pay | Admitting: Orthopaedic Surgery

## 2020-10-05 ENCOUNTER — Encounter: Payer: Self-pay | Admitting: Orthopaedic Surgery

## 2020-10-05 ENCOUNTER — Other Ambulatory Visit: Payer: Self-pay

## 2020-10-05 ENCOUNTER — Ambulatory Visit: Payer: Medicare PPO | Admitting: Orthopaedic Surgery

## 2020-10-05 DIAGNOSIS — Z6841 Body Mass Index (BMI) 40.0 and over, adult: Secondary | ICD-10-CM

## 2020-10-05 DIAGNOSIS — M25561 Pain in right knee: Secondary | ICD-10-CM | POA: Diagnosis not present

## 2020-10-05 DIAGNOSIS — G8929 Other chronic pain: Secondary | ICD-10-CM | POA: Diagnosis not present

## 2020-10-05 DIAGNOSIS — M25562 Pain in left knee: Secondary | ICD-10-CM | POA: Diagnosis not present

## 2020-10-05 MED ORDER — PREDNISONE 5 MG (21) PO TBPK: ORAL_TABLET | ORAL | 0 refills | Status: DC

## 2020-10-05 MED ORDER — HYDROCODONE-ACETAMINOPHEN 5-325 MG PO TABS: ORAL_TABLET | ORAL | 0 refills | Status: DC

## 2020-10-05 NOTE — Progress Notes (Signed)
PROCEDURE NOTE:  The patient requests injections of the left knee , verbal consent was obtained.  The left knee was prepped appropriately after time out was performed.   Sterile technique was observed and injection of 1 cc of Celestone 6 mg with several cc's of plain xylocaine. Anesthesia was provided by ethyl chloride and a 20-gauge needle was used to inject the knee area. The injection was tolerated well.  A band aid dressing was applied.  The patient was advised to apply ice later today and tomorrow to the injection sight as needed.  PROCEDURE NOTE:  The patient requests injections of the right knee , verbal consent was obtained.  The right knee was prepped appropriately after time out was performed.   Sterile technique was observed and injection of 1 cc of Celestone 6 mg with several cc's of plain xylocaine. Anesthesia was provided by ethyl chloride and a 20-gauge needle was used to inject the knee area. The injection was tolerated well.  A band aid dressing was applied.  The patient was advised to apply ice later today and tomorrow to the injection sight as needed.  I have reviewed the [place] Controlled Substance Reporting System web site prior to prescribing narcotic medicine for this patient.  Return in six weeks.  Call if any problem.  Precautions discussed.   Electronically Signed Darreld Mclean, MD 4/12/20221:52 PM

## 2020-10-12 ENCOUNTER — Other Ambulatory Visit: Payer: Self-pay

## 2020-10-12 ENCOUNTER — Encounter: Payer: Self-pay | Admitting: Orthopaedic Surgery

## 2020-10-12 ENCOUNTER — Ambulatory Visit (INDEPENDENT_AMBULATORY_CARE_PROVIDER_SITE_OTHER): Payer: Medicare PPO | Admitting: Orthopaedic Surgery

## 2020-10-12 DIAGNOSIS — M25511 Pain in right shoulder: Secondary | ICD-10-CM

## 2020-10-12 DIAGNOSIS — G8929 Other chronic pain: Secondary | ICD-10-CM | POA: Diagnosis not present

## 2020-10-12 NOTE — Progress Notes (Signed)
PROCEDURE NOTE:  The patient request injection, verbal consent was obtained.  The right shoulder was prepped appropriately after time out was performed.   Sterile technique was observed and injection of 1 cc of Celestone 6 mg with several cc's of plain xylocaine. Anesthesia was provided by ethyl chloride and a 20-gauge needle was used to inject the shoulder area. A posterior approach was used.  The injection was tolerated well.  A band aid dressing was applied.  The patient was advised to apply ice later today and tomorrow to the injection sight as needed.  See as needed.  Call if any problem.  Precautions discussed.   Electronically Signed Darreld Mclean, MD 4/19/20223:40 PM

## 2020-11-04 ENCOUNTER — Other Ambulatory Visit: Payer: Self-pay | Admitting: Orthopaedic Surgery

## 2020-11-16 ENCOUNTER — Other Ambulatory Visit: Payer: Self-pay

## 2020-11-16 ENCOUNTER — Ambulatory Visit: Payer: Medicare PPO | Admitting: Orthopaedic Surgery

## 2020-11-16 ENCOUNTER — Encounter: Payer: Self-pay | Admitting: Orthopaedic Surgery

## 2020-11-16 DIAGNOSIS — G8929 Other chronic pain: Secondary | ICD-10-CM

## 2020-11-16 DIAGNOSIS — M25561 Pain in right knee: Secondary | ICD-10-CM | POA: Diagnosis not present

## 2020-11-16 DIAGNOSIS — M25562 Pain in left knee: Secondary | ICD-10-CM | POA: Diagnosis not present

## 2020-11-16 NOTE — Progress Notes (Signed)
PROCEDURE NOTE:  The patient requests injections of the left knee , verbal consent was obtained.  The left knee was prepped appropriately after time out was performed.   Sterile technique was observed and injection of 1 cc of Celestone 6 mg with several cc's of plain xylocaine. Anesthesia was provided by ethyl chloride and a 20-gauge needle was used to inject the knee area. The injection was tolerated well.  A band aid dressing was applied.  The patient was advised to apply ice later today and tomorrow to the injection sight as needed.  PROCEDURE NOTE:  The patient requests injections of the right knee , verbal consent was obtained.  The right knee was prepped appropriately after time out was performed.   Sterile technique was observed and injection of 1 cc of Celestone 6 mg with several cc's of plain xylocaine. Anesthesia was provided by ethyl chloride and a 20-gauge needle was used to inject the knee area. The injection was tolerated well.  A band aid dressing was applied.  The patient was advised to apply ice later today and tomorrow to the injection sight as needed.  Return in one month.  Call if any problem.  Precautions discussed.   Electronically Signed Darreld Mclean, MD 5/24/20223:26 PM

## 2020-12-14 ENCOUNTER — Other Ambulatory Visit: Payer: Self-pay

## 2020-12-14 ENCOUNTER — Ambulatory Visit (INDEPENDENT_AMBULATORY_CARE_PROVIDER_SITE_OTHER): Payer: Medicare PPO | Admitting: Orthopaedic Surgery

## 2020-12-14 ENCOUNTER — Encounter: Payer: Self-pay | Admitting: Orthopaedic Surgery

## 2020-12-14 DIAGNOSIS — M25561 Pain in right knee: Secondary | ICD-10-CM | POA: Diagnosis not present

## 2020-12-14 DIAGNOSIS — M25562 Pain in left knee: Secondary | ICD-10-CM | POA: Diagnosis not present

## 2020-12-14 DIAGNOSIS — G8929 Other chronic pain: Secondary | ICD-10-CM | POA: Diagnosis not present

## 2020-12-14 MED ORDER — HYDROCODONE-ACETAMINOPHEN 5-325 MG PO TABS: ORAL_TABLET | ORAL | 0 refills | Status: DC

## 2020-12-14 NOTE — Progress Notes (Signed)
PROCEDURE NOTE:  The patient requests injections of the right knee , verbal consent was obtained.  The right knee was prepped appropriately after time out was performed.   Sterile technique was observed and injection of 1 cc of DepoMedrol 40mg  with several cc's of plain xylocaine. Anesthesia was provided by ethyl chloride and a 20-gauge needle was used to inject the knee area. The injection was tolerated well.  A band aid dressing was applied.  The patient was advised to apply ice later today and tomorrow to the injection sight as needed.  PROCEDURE NOTE:  The patient requests injections of the left knee , verbal consent was obtained.  The left knee was prepped appropriately after time out was performed.   Sterile technique was observed and injection of 1 cc of DepoMedrol 40 mg with several cc's of plain xylocaine. Anesthesia was provided by ethyl chloride and a 20-gauge needle was used to inject the knee area. The injection was tolerated well.  A band aid dressing was applied.  The patient was advised to apply ice later today and tomorrow to the injection sight as needed.  Return in one month.  I have reviewed the Controlled Substance Reporting System web site prior to prescribing narcotic medicine for this patient.  Call if any problem.  Precautions discussed.  Electronically Signed [place], MD 6/21/20223:49 PM

## 2020-12-22 ENCOUNTER — Telehealth: Payer: Self-pay | Admitting: Orthopaedic Surgery

## 2020-12-22 NOTE — Telephone Encounter (Signed)
Patient called to relay that her low back is hurting a lot. Said had injections in both knees last week, 12/14/20 when she saw Dr Hilda Lias. Asking if he would prescribe Prednisone for the back problem. Please advise. If so, pharmacy is [facility].

## 2020-12-23 MED ORDER — PREDNISONE 10 MG (21) PO TBPK: ORAL_TABLET | ORAL | 1 refills | Status: DC

## 2021-01-11 ENCOUNTER — Encounter: Payer: Self-pay | Admitting: Orthopaedic Surgery

## 2021-01-11 ENCOUNTER — Other Ambulatory Visit: Payer: Self-pay

## 2021-01-11 ENCOUNTER — Ambulatory Visit (INDEPENDENT_AMBULATORY_CARE_PROVIDER_SITE_OTHER): Payer: Medicare PPO | Admitting: Orthopaedic Surgery

## 2021-01-11 DIAGNOSIS — M25562 Pain in left knee: Secondary | ICD-10-CM

## 2021-01-11 DIAGNOSIS — G8929 Other chronic pain: Secondary | ICD-10-CM | POA: Diagnosis not present

## 2021-01-11 DIAGNOSIS — M25561 Pain in right knee: Secondary | ICD-10-CM

## 2021-01-11 MED ORDER — HYDROCODONE-ACETAMINOPHEN 5-325 MG PO TABS: ORAL_TABLET | ORAL | 0 refills | Status: DC

## 2021-01-11 NOTE — Progress Notes (Signed)
PROCEDURE NOTE:  The patient requests injections of the right knee , verbal consent was obtained.  The right knee was prepped appropriately after time out was performed.   Sterile technique was observed and injection of 1 cc of Celestone 6mg  with several cc's of plain xylocaine. Anesthesia was provided by ethyl chloride and a 20-gauge needle was used to inject the knee area. The injection was tolerated well.  A band aid dressing was applied.  The patient was advised to apply ice later today and tomorrow to the injection sight as needed.   PROCEDURE NOTE:  The patient requests injections of the left knee , verbal consent was obtained.  The left knee was prepped appropriately after time out was performed.   Sterile technique was observed and injection of 1 cc of Celestone 6 mg with several cc's of plain xylocaine. Anesthesia was provided by ethyl chloride and a 20-gauge needle was used to inject the knee area. The injection was tolerated well.  A band aid dressing was applied.  The patient was advised to apply ice later today and tomorrow to the injection sight as needed.   I have reviewed the Controlled Substance Reporting System web site prior to prescribing narcotic medicine for this patient.  Return in one month.  Call if any problem.  Precautions discussed.  Electronically Signed [place], MD 7/19/20222:27 PM

## 2021-01-26 ENCOUNTER — Telehealth: Payer: Self-pay | Admitting: Orthopaedic Surgery

## 2021-02-10 ENCOUNTER — Other Ambulatory Visit: Payer: Self-pay

## 2021-02-10 ENCOUNTER — Encounter: Payer: Self-pay | Admitting: Orthopaedic Surgery

## 2021-02-10 ENCOUNTER — Ambulatory Visit (INDEPENDENT_AMBULATORY_CARE_PROVIDER_SITE_OTHER): Payer: Medicare PPO | Admitting: Orthopaedic Surgery

## 2021-02-10 DIAGNOSIS — M25562 Pain in left knee: Secondary | ICD-10-CM | POA: Diagnosis not present

## 2021-02-10 DIAGNOSIS — M25561 Pain in right knee: Secondary | ICD-10-CM

## 2021-02-10 DIAGNOSIS — Z6841 Body Mass Index (BMI) 40.0 and over, adult: Secondary | ICD-10-CM

## 2021-02-10 DIAGNOSIS — G8929 Other chronic pain: Secondary | ICD-10-CM | POA: Diagnosis not present

## 2021-02-10 MED ORDER — HYDROCODONE-ACETAMINOPHEN 5-325 MG PO TABS: ORAL_TABLET | ORAL | 0 refills | Status: DC

## 2021-02-10 NOTE — Progress Notes (Signed)
PROCEDURE NOTE:  The patient requests injections of the right knee , verbal consent was obtained.  The right knee was prepped appropriately after time out was performed.   Sterile technique was observed and injection of 1 cc of Celestone 6mg  with several cc's of plain xylocaine. Anesthesia was provided by ethyl chloride and a 20-gauge needle was used to inject the knee area. The injection was tolerated well.  A band aid dressing was applied.  The patient was advised to apply ice later today and tomorrow to the injection sight as needed.   PROCEDURE NOTE:  The patient requests injections of the left knee , verbal consent was obtained.  The left knee was prepped appropriately after time out was performed.   Sterile technique was observed and injection of 1 cc of Celestone 6 mg with several cc's of plain xylocaine. Anesthesia was provided by ethyl chloride and a 20-gauge needle was used to inject the knee area. The injection was tolerated well.  A band aid dressing was applied.  The patient was advised to apply ice later today and tomorrow to the injection sight as needed.   I have reviewed the Controlled Substance Reporting System web site prior to prescribing narcotic medicine for this patient.  Return in one month.  Call if any problem.  Precautions discussed.  Electronically Signed [place], MD 8/18/202210:21 AM

## 2021-02-24 ENCOUNTER — Ambulatory Visit: Payer: Medicare PPO | Admitting: Orthopaedic Surgery

## 2021-02-24 ENCOUNTER — Encounter: Payer: Self-pay | Admitting: Orthopaedic Surgery

## 2021-02-24 ENCOUNTER — Other Ambulatory Visit: Payer: Self-pay

## 2021-02-24 DIAGNOSIS — G8929 Other chronic pain: Secondary | ICD-10-CM | POA: Diagnosis not present

## 2021-02-24 DIAGNOSIS — M25511 Pain in right shoulder: Secondary | ICD-10-CM | POA: Diagnosis not present

## 2021-02-24 DIAGNOSIS — Z6841 Body Mass Index (BMI) 40.0 and over, adult: Secondary | ICD-10-CM

## 2021-02-24 NOTE — Progress Notes (Signed)
PROCEDURE NOTE:  The patient request injection, verbal consent was obtained.  The right shoulder was prepped appropriately after time out was performed.   Sterile technique was observed and injection of 1 cc of Celestone 6 mg with several cc's of plain xylocaine. Anesthesia was provided by ethyl chloride and a 20-gauge needle was used to inject the shoulder area. A posterior approach was used.  The injection was tolerated well.  A band aid dressing was applied.  The patient was advised to apply ice later today and tomorrow to the injection sight as needed.   See as needed.  Call if any problem.  Precautions discussed.  Electronically Signed Darreld Mclean, MD 9/1/202210:47 AM

## 2021-03-10 ENCOUNTER — Other Ambulatory Visit: Payer: Self-pay

## 2021-03-10 ENCOUNTER — Ambulatory Visit (INDEPENDENT_AMBULATORY_CARE_PROVIDER_SITE_OTHER): Payer: Medicare PPO | Admitting: Orthopaedic Surgery

## 2021-03-10 ENCOUNTER — Encounter: Payer: Self-pay | Admitting: Orthopaedic Surgery

## 2021-03-10 DIAGNOSIS — M25561 Pain in right knee: Secondary | ICD-10-CM | POA: Diagnosis not present

## 2021-03-10 DIAGNOSIS — G8929 Other chronic pain: Secondary | ICD-10-CM

## 2021-03-10 DIAGNOSIS — M25562 Pain in left knee: Secondary | ICD-10-CM | POA: Diagnosis not present

## 2021-03-10 DIAGNOSIS — Z6841 Body Mass Index (BMI) 40.0 and over, adult: Secondary | ICD-10-CM

## 2021-03-10 MED ORDER — HYDROCODONE-ACETAMINOPHEN 5-325 MG PO TABS: ORAL_TABLET | ORAL | 0 refills | Status: DC

## 2021-03-10 NOTE — Progress Notes (Signed)
PROCEDURE NOTE:  The patient requests injections of the right knee , verbal consent was obtained.  The right knee was prepped appropriately after time out was performed.   Sterile technique was observed and injection of 1 cc of Celestone 6mg  with several cc's of plain xylocaine. Anesthesia was provided by ethyl chloride and a 20-gauge needle was used to inject the knee area. The injection was tolerated well.  A band aid dressing was applied.  The patient was advised to apply ice later today and tomorrow to the injection sight as needed.   PROCEDURE NOTE:  The patient requests injections of the left knee , verbal consent was obtained.  The left knee was prepped appropriately after time out was performed.   Sterile technique was observed and injection of 1 cc of Celestone 6 mg with several cc's of plain xylocaine. Anesthesia was provided by ethyl chloride and a 20-gauge needle was used to inject the knee area. The injection was tolerated well.  A band aid dressing was applied.  The patient was advised to apply ice later today and tomorrow to the injection sight as needed.   I have reviewed the Controlled Substance Reporting System web site prior to prescribing narcotic medicine for this patient.  See as needed.  Encounter Diagnoses  Name Primary?   Chronic pain of both knees Yes   Body mass index 45.0-49.9, adult (HCC)    Morbid obesity (HCC)    Call if any problem.  Precautions discussed.  Electronically Signed [birth year]1983, MD 9/15/202210:46 AM

## 2021-03-15 ENCOUNTER — Telehealth: Payer: Self-pay | Admitting: Orthopaedic Surgery

## 2021-03-15 MED ORDER — PREDNISONE 10 MG (21) PO TBPK: ORAL_TABLET | ORAL | 1 refills | Status: DC

## 2021-03-15 NOTE — Telephone Encounter (Signed)
Ms. Guastella called and said that she had bilateral knee injections last Thursday the 15th but they dont seem to have helped her any.    She wants to know if you can prescribe her Prednisone.  She said that helped in that past.  She uses [facility]

## 2021-04-05 ENCOUNTER — Encounter: Payer: Self-pay | Admitting: Orthopaedic Surgery

## 2021-04-05 ENCOUNTER — Ambulatory Visit: Payer: Medicare PPO | Admitting: Orthopaedic Surgery

## 2021-04-05 ENCOUNTER — Other Ambulatory Visit: Payer: Self-pay

## 2021-04-05 DIAGNOSIS — M25561 Pain in right knee: Secondary | ICD-10-CM | POA: Diagnosis not present

## 2021-04-05 DIAGNOSIS — M25562 Pain in left knee: Secondary | ICD-10-CM

## 2021-04-05 DIAGNOSIS — G8929 Other chronic pain: Secondary | ICD-10-CM

## 2021-04-05 MED ORDER — HYDROCODONE-ACETAMINOPHEN 5-325 MG PO TABS: ORAL_TABLET | ORAL | 0 refills | Status: DC

## 2021-04-05 NOTE — Progress Notes (Signed)
PROCEDURE NOTE:  The patient requests injections of the right knee , verbal consent was obtained.  The right knee was prepped appropriately after time out was performed.   Sterile technique was observed and injection of 1 cc of Celestone 6mg  with several cc's of plain xylocaine. Anesthesia was provided by ethyl chloride and a 20-gauge needle was used to inject the knee area. The injection was tolerated well.  A band aid dressing was applied.  The patient was advised to apply ice later today and tomorrow to the injection sight as needed.   PROCEDURE NOTE:  The patient requests injections of the left knee , verbal consent was obtained.  The left knee was prepped appropriately after time out was performed.   Sterile technique was observed and injection of 1 cc of Celestone 6 mg with several cc's of plain xylocaine. Anesthesia was provided by ethyl chloride and a 20-gauge needle was used to inject the knee area. The injection was tolerated well.  A band aid dressing was applied.  The patient was advised to apply ice later today and tomorrow to the injection sight as needed.   Encounter Diagnosis  Name Primary?   Chronic pain of both knees Yes   I have reviewed the Controlled Substance Reporting System web site prior to prescribing narcotic medicine for this patient.  Return in one month.  Call if any problem.  Precautions discussed.  Electronically Signed [place], MD 10/11/20229:48 AM

## 2021-04-21 ENCOUNTER — Other Ambulatory Visit: Payer: Self-pay | Admitting: Orthopaedic Surgery

## 2021-05-03 ENCOUNTER — Encounter: Payer: Self-pay | Admitting: Orthopaedic Surgery

## 2021-05-03 ENCOUNTER — Other Ambulatory Visit: Payer: Self-pay

## 2021-05-03 ENCOUNTER — Ambulatory Visit (INDEPENDENT_AMBULATORY_CARE_PROVIDER_SITE_OTHER): Payer: Medicare PPO | Admitting: Orthopaedic Surgery

## 2021-05-03 DIAGNOSIS — G8929 Other chronic pain: Secondary | ICD-10-CM | POA: Diagnosis not present

## 2021-05-03 DIAGNOSIS — M25562 Pain in left knee: Secondary | ICD-10-CM

## 2021-05-03 DIAGNOSIS — M5441 Lumbago with sciatica, right side: Secondary | ICD-10-CM

## 2021-05-03 DIAGNOSIS — M25561 Pain in right knee: Secondary | ICD-10-CM

## 2021-05-03 DIAGNOSIS — Z6841 Body Mass Index (BMI) 40.0 and over, adult: Secondary | ICD-10-CM

## 2021-05-03 MED ORDER — HYDROCODONE-ACETAMINOPHEN 5-325 MG PO TABS: ORAL_TABLET | ORAL | 0 refills | Status: DC

## 2021-05-03 MED ORDER — NAPROXEN 500 MG PO TABS: 500.0000 mg | ORAL_TABLET | Freq: Two times a day (BID) | ORAL | 5 refills | Status: DC

## 2021-05-03 NOTE — Progress Notes (Signed)
PROCEDURE NOTE:  The patient requests injections of the right knee , verbal consent was obtained.  The right knee was prepped appropriately after time out was performed.   Sterile technique was observed and injection of 1 cc of DepoMedrol 40mg  with several cc's of plain xylocaine. Anesthesia was provided by ethyl chloride and a 20-gauge needle was used to inject the knee area. The injection was tolerated well.  A band aid dressing was applied.  The patient was advised to apply ice later today and tomorrow to the injection sight as needed.   PROCEDURE NOTE:  The patient requests injections of the left knee , verbal consent was obtained.  The left knee was prepped appropriately after time out was performed.   Sterile technique was observed and injection of 1 cc of DepoMedrol 40 mg with several cc's of plain xylocaine. Anesthesia was provided by ethyl chloride and a 20-gauge needle was used to inject the knee area. The injection was tolerated well.  A band aid dressing was applied.  The patient was advised to apply ice later today and tomorrow to the injection sight as needed.   Encounter Diagnoses  Name Primary?   Chronic pain of both knees Yes   Body mass index 45.0-49.9, adult (HCC)    Morbid obesity (HCC)    Chronic midline low back pain with right-sided sciatica    I will give Naprosyn 500 po bid pc.  I will refill pain medicine.  I have reviewed the 11-19-1983 Controlled Substance Reporting System web site prior to prescribing narcotic medicine for this patient.  Return in one month.  Call if any problem.  Precautions discussed.  Electronically Signed [place], MD 11/8/202210:26 AM

## 2021-05-09 ENCOUNTER — Other Ambulatory Visit: Payer: Self-pay | Admitting: Orthopedic Surgery

## 2021-06-02 ENCOUNTER — Other Ambulatory Visit: Payer: Self-pay

## 2021-06-02 ENCOUNTER — Ambulatory Visit: Payer: Medicare PPO | Admitting: Orthopaedic Surgery

## 2021-06-02 ENCOUNTER — Encounter: Payer: Self-pay | Admitting: Orthopaedic Surgery

## 2021-06-02 DIAGNOSIS — M25562 Pain in left knee: Secondary | ICD-10-CM | POA: Diagnosis not present

## 2021-06-02 DIAGNOSIS — Z6841 Body Mass Index (BMI) 40.0 and over, adult: Secondary | ICD-10-CM

## 2021-06-02 DIAGNOSIS — G8929 Other chronic pain: Secondary | ICD-10-CM | POA: Diagnosis not present

## 2021-06-02 DIAGNOSIS — M25561 Pain in right knee: Secondary | ICD-10-CM | POA: Diagnosis not present

## 2021-06-02 MED ORDER — PREDNISONE 5 MG (21) PO TBPK: ORAL_TABLET | ORAL | 0 refills | Status: DC

## 2021-06-02 MED ORDER — HYDROCODONE-ACETAMINOPHEN 5-325 MG PO TABS: ORAL_TABLET | ORAL | 0 refills | Status: DC

## 2021-06-02 NOTE — Progress Notes (Signed)
PROCEDURE NOTE:  The patient requests injections of the left knee , verbal consent was obtained.  The left knee was prepped appropriately after time out was performed.   Sterile technique was observed and injection of 1 cc of DepoMedrol 40 mg with several cc's of plain xylocaine. Anesthesia was provided by ethyl chloride and a 20-gauge needle was used to inject the knee area. The injection was tolerated well.  A band aid dressing was applied.  The patient was advised to apply ice later today and tomorrow to the injection sight as needed.  PROCEDURE NOTE:  The patient requests injections of the right knee , verbal consent was obtained.  The right knee was prepped appropriately after time out was performed.   Sterile technique was observed and injection of 1 cc of DepoMedrol 40mg  with several cc's of plain xylocaine. Anesthesia was provided by ethyl chloride and a 20-gauge needle was used to inject the knee area. The injection was tolerated well.  A band aid dressing was applied.  The patient was advised to apply ice later today and tomorrow to the injection sight as needed.   Encounter Diagnoses  Name Primary?   Chronic pain of both knees Yes   Body mass index 45.0-49.9, adult (HCC)    Morbid obesity (HCC)    I will call in pain medicine.  I have reviewed the 11-19-1983 Controlled Substance Reporting System web site prior to prescribing narcotic medicine for this patient.  She has recurrent lower back pain.  I will call in prednisone pack which has helped in the past.  Return in one month.  Call if any problem.  Precautions discussed.  Electronically Signed [place], MD 12/8/20229:42 AM

## 2021-06-24 ENCOUNTER — Other Ambulatory Visit: Payer: Self-pay | Admitting: Orthopedic Surgery

## 2021-06-30 ENCOUNTER — Ambulatory Visit: Payer: Medicare PPO | Admitting: Orthopaedic Surgery

## 2021-06-30 ENCOUNTER — Other Ambulatory Visit: Payer: Self-pay

## 2021-06-30 ENCOUNTER — Encounter: Payer: Self-pay | Admitting: Orthopaedic Surgery

## 2021-06-30 DIAGNOSIS — G8929 Other chronic pain: Secondary | ICD-10-CM

## 2021-06-30 DIAGNOSIS — M25562 Pain in left knee: Secondary | ICD-10-CM | POA: Diagnosis not present

## 2021-06-30 DIAGNOSIS — M25561 Pain in right knee: Secondary | ICD-10-CM

## 2021-06-30 DIAGNOSIS — Z6841 Body Mass Index (BMI) 40.0 and over, adult: Secondary | ICD-10-CM

## 2021-06-30 MED ORDER — HYDROCODONE-ACETAMINOPHEN 5-325 MG PO TABS: ORAL_TABLET | ORAL | 0 refills | Status: DC

## 2021-06-30 NOTE — Progress Notes (Signed)
PROCEDURE NOTE:  The patient requests injections of the left knee , verbal consent was obtained.  The left knee was prepped appropriately after time out was performed.   Sterile technique was observed and injection of 1 cc of DepoMedrol 40 mg with several cc's of plain xylocaine. Anesthesia was provided by ethyl chloride and a 20-gauge needle was used to inject the knee area. The injection was tolerated well.  A band aid dressing was applied.  The patient was advised to apply ice later today and tomorrow to the injection sight as needed.  PROCEDURE NOTE:  The patient requests injections of the right knee , verbal consent was obtained.  The right knee was prepped appropriately after time out was performed.   Sterile technique was observed and injection of 1 cc of DepoMedrol 40mg  with several cc's of plain xylocaine. Anesthesia was provided by ethyl chloride and a 20-gauge needle was used to inject the knee area. The injection was tolerated well.  A band aid dressing was applied.  The patient was advised to apply ice later today and tomorrow to the injection sight as needed.  Encounter Diagnoses  Name Primary?   Chronic pain of both knees Yes   Body mass index 45.0-49.9, adult (HCC)    Morbid obesity (HCC)    I have reviewed the 11-19-1983 Controlled Substance Reporting System web site prior to prescribing narcotic medicine for this patient.  Return in one month.  Call if any problem.  Precautions discussed.  Electronically Signed [place], MD 1/5/20239:59 AM

## 2021-07-18 ENCOUNTER — Telehealth: Payer: Self-pay | Admitting: Orthopaedic Surgery

## 2021-07-28 ENCOUNTER — Ambulatory Visit: Payer: Medicare PPO | Admitting: Orthopaedic Surgery

## 2021-08-02 ENCOUNTER — Telehealth: Payer: Self-pay | Admitting: Orthopaedic Surgery

## 2021-08-02 ENCOUNTER — Ambulatory Visit (INDEPENDENT_AMBULATORY_CARE_PROVIDER_SITE_OTHER): Payer: Medicare PPO | Admitting: Orthopaedic Surgery

## 2021-08-02 ENCOUNTER — Other Ambulatory Visit: Payer: Self-pay

## 2021-08-02 ENCOUNTER — Encounter: Payer: Self-pay | Admitting: Orthopaedic Surgery

## 2021-08-02 DIAGNOSIS — M25562 Pain in left knee: Secondary | ICD-10-CM

## 2021-08-02 DIAGNOSIS — G8929 Other chronic pain: Secondary | ICD-10-CM

## 2021-08-02 DIAGNOSIS — M25561 Pain in right knee: Secondary | ICD-10-CM | POA: Diagnosis not present

## 2021-08-02 DIAGNOSIS — Z6841 Body Mass Index (BMI) 40.0 and over, adult: Secondary | ICD-10-CM

## 2021-08-02 MED ORDER — HYDROCODONE-ACETAMINOPHEN 5-325 MG PO TABS: ORAL_TABLET | ORAL | 0 refills | Status: DC

## 2021-08-02 NOTE — Telephone Encounter (Signed)
Patient called stating she called her pharmacy and her medicine is not there they said.  HYDROcodone-acetaminophen (NORCO/VICODIN) 5-325 MG tablet  Pharmacy:  [facility]

## 2021-08-02 NOTE — Progress Notes (Signed)
PROCEDURE NOTE:  The patient requests injections of the left knee , verbal consent was obtained.  The left knee was prepped appropriately after time out was performed.   Sterile technique was observed and injection of 1 cc of DepoMedrol 40 mg with several cc's of plain xylocaine. Anesthesia was provided by ethyl chloride and a 20-gauge needle was used to inject the knee area. The injection was tolerated well.  A band aid dressing was applied.  The patient was advised to apply ice later today and tomorrow to the injection sight as needed.  PROCEDURE NOTE:  The patient requests injections of the right knee , verbal consent was obtained.  The right knee was prepped appropriately after time out was performed.   Sterile technique was observed and injection of 1 cc of DepoMedrol 40mg  with several cc's of plain xylocaine. Anesthesia was provided by ethyl chloride and a 20-gauge needle was used to inject the knee area. The injection was tolerated well.  A band aid dressing was applied.  The patient was advised to apply ice later today and tomorrow to the injection sight as needed.  Encounter Diagnoses  Name Primary?   Chronic pain of both knees Yes   Body mass index 45.0-49.9, adult (HCC)    Morbid obesity (HCC)    Return in four weeks.  Call if any problem.  Precautions discussed.  Electronically Signed 11-19-1984, MD 2/7/202310:17 AM

## 2021-08-16 ENCOUNTER — Encounter: Payer: Self-pay | Admitting: Orthopaedic Surgery

## 2021-08-16 ENCOUNTER — Other Ambulatory Visit: Payer: Self-pay

## 2021-08-16 ENCOUNTER — Ambulatory Visit: Payer: Medicare PPO

## 2021-08-16 ENCOUNTER — Ambulatory Visit: Payer: Medicare PPO | Admitting: Orthopaedic Surgery

## 2021-08-16 VITALS — BP 169/69 | HR 73 | Ht 63.5 in | Wt 270.0 lb

## 2021-08-16 DIAGNOSIS — G8929 Other chronic pain: Secondary | ICD-10-CM | POA: Diagnosis not present

## 2021-08-16 DIAGNOSIS — M25562 Pain in left knee: Secondary | ICD-10-CM | POA: Diagnosis not present

## 2021-08-16 DIAGNOSIS — M898X5 Other specified disorders of bone, thigh: Secondary | ICD-10-CM

## 2021-08-16 DIAGNOSIS — E785 Hyperlipidemia, unspecified: Secondary | ICD-10-CM | POA: Insufficient documentation

## 2021-08-16 DIAGNOSIS — R600 Localized edema: Secondary | ICD-10-CM | POA: Insufficient documentation

## 2021-08-16 DIAGNOSIS — Z9181 History of falling: Secondary | ICD-10-CM | POA: Insufficient documentation

## 2021-08-16 MED ORDER — HYDROCODONE-ACETAMINOPHEN 5-325 MG PO TABS: ORAL_TABLET | ORAL | 0 refills | Status: DC

## 2021-08-16 NOTE — Progress Notes (Signed)
My left thigh hurts.  She twisted her left leg getting out of the shower yesterday and has had pain weight bearing on the left leg.  Most of the pain is in the mid anterior thigh.  She has pain in the knee but that is chronic. She has swelling and popping but that is chronic, no change.  No redness, no paresthesias are present.  Left knee has effusion, crepitus, ROM 0 to 105, limp left, pain left mid anterior thigh but no redness or swelling.  NV intact.  X-rays were done of the left femur, reported separately.  Encounter Diagnoses  Name Primary?   Pain of left femur Yes   Chronic pain of left knee    PROCEDURE NOTE:  The patient requests injections of the left knee , verbal consent was obtained.  The left knee was prepped appropriately after time out was performed.   Sterile technique was observed and injection of 1 cc of DepoMedrol 40 mg with several cc's of plain xylocaine. Anesthesia was provided by ethyl chloride and a 20-gauge needle was used to inject the knee area. The injection was tolerated well.  A band aid dressing was applied.  The patient was advised to apply ice later today and tomorrow to the injection sight as needed.  Use ice to the femur where it hurts.  I have reviewed the [place] Controlled Substance Reporting System web site prior to prescribing narcotic medicine for this patient.  Call if any problem.  Precautions discussed.  Electronically Signed Darreld Mclean, MD 2/21/20232:04 PM

## 2021-08-16 NOTE — Patient Instructions (Signed)
As scheduled

## 2021-08-27 ENCOUNTER — Other Ambulatory Visit: Payer: Self-pay | Admitting: Orthopaedic Surgery

## 2021-08-30 ENCOUNTER — Encounter: Payer: Self-pay | Admitting: Orthopaedic Surgery

## 2021-08-30 ENCOUNTER — Ambulatory Visit (INDEPENDENT_AMBULATORY_CARE_PROVIDER_SITE_OTHER): Payer: Medicare PPO | Admitting: Orthopaedic Surgery

## 2021-08-30 ENCOUNTER — Other Ambulatory Visit: Payer: Self-pay

## 2021-08-30 DIAGNOSIS — M25561 Pain in right knee: Secondary | ICD-10-CM | POA: Diagnosis not present

## 2021-08-30 DIAGNOSIS — M25562 Pain in left knee: Secondary | ICD-10-CM

## 2021-08-30 DIAGNOSIS — G8929 Other chronic pain: Secondary | ICD-10-CM | POA: Diagnosis not present

## 2021-08-30 DIAGNOSIS — Z6841 Body Mass Index (BMI) 40.0 and over, adult: Secondary | ICD-10-CM

## 2021-08-30 NOTE — Progress Notes (Signed)
PROCEDURE NOTE: ? ?The patient requests injections of the left knee , verbal consent was obtained. ? ?The left knee was prepped appropriately after time out was performed.  ? ?Sterile technique was observed and injection of 1 cc of DepoMedrol 40 mg with several cc's of plain xylocaine. Anesthesia was provided by ethyl chloride and a 20-gauge needle was used to inject the knee area. The injection was tolerated well.  A band aid dressing was applied. ? ?The patient was advised to apply ice later today and tomorrow to the injection sight as needed. ? ?PROCEDURE NOTE: ? ?The patient requests injections of the right knee , verbal consent was obtained. ? ?The right knee was prepped appropriately after time out was performed.  ? ?Sterile technique was observed and injection of 1 cc of DepoMedrol 40mg  with several cc's of plain xylocaine. Anesthesia was provided by ethyl chloride and a 20-gauge needle was used to inject the knee area. The injection was tolerated well.  A band aid dressing was applied. ? ?The patient was advised to apply ice later today and tomorrow to the injection sight as needed. ? ?Encounter Diagnoses  ?Name Primary?  ? Chronic pain of both knees Yes  ? Body mass index 45.0-49.9, adult ([place])   ? Morbid obesity ([place])   ? ?Return in one month. ? ?Call if any problem. ? ?Precautions discussed. ? ?Electronically Signed ?Sanjuana Kava, MD ?3/7/20231:52 PM ? ?

## 2021-09-12 ENCOUNTER — Telehealth: Payer: Self-pay | Admitting: Orthopaedic Surgery

## 2021-09-12 MED ORDER — PREDNISONE 5 MG (21) PO TBPK: ORAL_TABLET | ORAL | 0 refills | Status: DC

## 2021-09-12 MED ORDER — HYDROCODONE-ACETAMINOPHEN 5-325 MG PO TABS: ORAL_TABLET | ORAL | 0 refills | Status: DC

## 2021-09-12 NOTE — Telephone Encounter (Signed)
Patient called requesting refill for her pain medicine.  ? ?HYDROcodone-acetaminophen (NORCO/VICODIN) 5-325 MG tablet ? ?And she is also requesting a refill on her  ? ? ?predniSONE (STERAPRED UNI-PAK 21 TAB) 5 MG (21) TBPK tablet  ? ?For her back ^^^^^  ? ? ?Pharmacy:  [facility]  ?

## 2021-09-27 ENCOUNTER — Encounter: Payer: Self-pay | Admitting: Orthopaedic Surgery

## 2021-09-27 ENCOUNTER — Ambulatory Visit: Payer: Medicare PPO | Admitting: Orthopaedic Surgery

## 2021-09-27 DIAGNOSIS — G8929 Other chronic pain: Secondary | ICD-10-CM

## 2021-09-27 DIAGNOSIS — M25562 Pain in left knee: Secondary | ICD-10-CM | POA: Diagnosis not present

## 2021-09-27 DIAGNOSIS — Z6841 Body Mass Index (BMI) 40.0 and over, adult: Secondary | ICD-10-CM

## 2021-09-27 DIAGNOSIS — M25561 Pain in right knee: Secondary | ICD-10-CM

## 2021-09-27 NOTE — Progress Notes (Signed)
PROCEDURE NOTE: ? ?The patient requests injections of the left knee , verbal consent was obtained. ? ?The left knee was prepped appropriately after time out was performed.  ? ?Sterile technique was observed and injection of 1 cc of DepoMedrol 40 mg with several cc's of plain xylocaine. Anesthesia was provided by ethyl chloride and a 20-gauge needle was used to inject the knee area. The injection was tolerated well.  A band aid dressing was applied. ? ?The patient was advised to apply ice later today and tomorrow to the injection sight as needed. ? ?PROCEDURE NOTE: ? ?The patient requests injections of the right knee , verbal consent was obtained. ? ?The right knee was prepped appropriately after time out was performed.  ? ?Sterile technique was observed and injection of 1 cc of DepoMedrol 40mg  with several cc's of plain xylocaine. Anesthesia was provided by ethyl chloride and a 20-gauge needle was used to inject the knee area. The injection was tolerated well.  A band aid dressing was applied. ? ?The patient was advised to apply ice later today and tomorrow to the injection sight as needed. ? ?Encounter Diagnoses  ?Name Primary?  ? Chronic pain of both knees Yes  ? Body mass index 45.0-49.9, adult (HCC)   ? Morbid obesity (HCC)   ? ?Return in one month. ? ?I have asked her to consider possible total knee replacement.  She will discuss with her family doctor. ? ?Call if any problem. ? ?Precautions discussed. ? ?Electronically Signed ?11-19-1984, MD ?4/4/20232:12 PM ? ?

## 2021-10-13 ENCOUNTER — Telehealth: Payer: Self-pay

## 2021-10-13 NOTE — Telephone Encounter (Signed)
Hydrocodone-Acetaminophen 5/325 MG  Qty 50 Tablets ? ? ? ? ?PATIENT USES [facility] ?

## 2021-10-14 MED ORDER — HYDROCODONE-ACETAMINOPHEN 5-325 MG PO TABS: ORAL_TABLET | ORAL | 0 refills | Status: DC

## 2021-10-21 ENCOUNTER — Telehealth: Payer: Self-pay | Admitting: Orthopaedic Surgery

## 2021-10-25 ENCOUNTER — Ambulatory Visit: Payer: Medicare PPO | Admitting: Orthopaedic Surgery

## 2021-10-25 ENCOUNTER — Encounter: Payer: Self-pay | Admitting: Orthopaedic Surgery

## 2021-10-25 DIAGNOSIS — Z6841 Body Mass Index (BMI) 40.0 and over, adult: Secondary | ICD-10-CM

## 2021-10-25 DIAGNOSIS — M25561 Pain in right knee: Secondary | ICD-10-CM

## 2021-10-25 DIAGNOSIS — G8929 Other chronic pain: Secondary | ICD-10-CM

## 2021-10-25 DIAGNOSIS — M25562 Pain in left knee: Secondary | ICD-10-CM

## 2021-10-25 NOTE — Progress Notes (Signed)
PROCEDURE NOTE: ? ?The patient requests injections of the left knee , verbal consent was obtained. ? ?The left knee was prepped appropriately after time out was performed.  ? ?Sterile technique was observed and injection of 1 cc of DepoMedrol 40 mg with several cc's of plain xylocaine. Anesthesia was provided by ethyl chloride and a 20-gauge needle was used to inject the knee area. The injection was tolerated well.  A band aid dressing was applied. ? ?The patient was advised to apply ice later today and tomorrow to the injection sight as needed. ? ?PROCEDURE NOTE: ? ?The patient requests injections of the right knee , verbal consent was obtained. ? ?The right knee was prepped appropriately after time out was performed.  ? ?Sterile technique was observed and injection of 1 cc of DepoMedrol 40mg  with several cc's of plain xylocaine. Anesthesia was provided by ethyl chloride and a 20-gauge needle was used to inject the knee area. The injection was tolerated well.  A band aid dressing was applied. ? ?The patient was advised to apply ice later today and tomorrow to the injection sight as needed. ? ?Encounter Diagnoses  ?Name Primary?  ? Chronic pain of both knees Yes  ? Body mass index 45.0-49.9, adult ([place])   ? Morbid obesity ([place])   ? ?Return prn ? ?Call if any problem. ? ?Precautions discussed. ? ?Electronically Signed ?Sanjuana Kava, MD ?5/2/20231:50 PM ? ?

## 2021-11-15 ENCOUNTER — Other Ambulatory Visit: Payer: Self-pay | Admitting: Orthopaedic Surgery

## 2021-11-15 MED ORDER — HYDROCODONE-ACETAMINOPHEN 5-325 MG PO TABS: 1.0000 | ORAL_TABLET | Freq: Four times a day (QID) | ORAL | 0 refills | Status: AC | PRN

## 2021-11-15 NOTE — Telephone Encounter (Signed)
Patient called to request refill - aware while Dr Luna Glasgow is out of clinic until 11/29/21 that our providers are reviewing requests: HYDROcodone-acetaminophen (NORCO/VICODIN) 5-325 MG tablet 50 tablet       [facility]

## 2021-11-15 NOTE — Telephone Encounter (Signed)
Done

## 2021-11-29 ENCOUNTER — Ambulatory Visit (INDEPENDENT_AMBULATORY_CARE_PROVIDER_SITE_OTHER): Payer: Medicare PPO | Admitting: Orthopaedic Surgery

## 2021-11-29 ENCOUNTER — Encounter: Payer: Self-pay | Admitting: Orthopaedic Surgery

## 2021-11-29 DIAGNOSIS — G8929 Other chronic pain: Secondary | ICD-10-CM

## 2021-11-29 DIAGNOSIS — M25561 Pain in right knee: Secondary | ICD-10-CM | POA: Diagnosis not present

## 2021-11-29 DIAGNOSIS — Z6841 Body Mass Index (BMI) 40.0 and over, adult: Secondary | ICD-10-CM

## 2021-11-29 DIAGNOSIS — M25562 Pain in left knee: Secondary | ICD-10-CM

## 2021-11-29 MED ORDER — HYDROCODONE-ACETAMINOPHEN 7.5-325 MG PO TABS: ORAL_TABLET | ORAL | 0 refills | Status: DC

## 2021-11-29 NOTE — Progress Notes (Signed)
PROCEDURE NOTE:  The patient requests injections of the left knee , verbal consent was obtained.  The left knee was prepped appropriately after time out was performed.   Sterile technique was observed and injection of 1 cc of DepoMedrol 40 mg with several cc's of plain xylocaine. Anesthesia was provided by ethyl chloride and a 20-gauge needle was used to inject the knee area. The injection was tolerated well.  A band aid dressing was applied.  The patient was advised to apply ice later today and tomorrow to the injection sight as needed.  PROCEDURE NOTE:  The patient requests injections of the right knee , verbal consent was obtained.  The right knee was prepped appropriately after time out was performed.   Sterile technique was observed and injection of 1 cc of DepoMedrol 40mg  with several cc's of plain xylocaine. Anesthesia was provided by ethyl chloride and a 20-gauge needle was used to inject the knee area. The injection was tolerated well.  A band aid dressing was applied.  The patient was advised to apply ice later today and tomorrow to the injection sight as needed.  Encounter Diagnoses  Name Primary?   Chronic pain of both knees Yes   Body mass index 45.0-49.9, adult (HCC)    Morbid obesity (HCC)    I have reviewed the 11-19-1983 Controlled Substance Reporting System web site prior to prescribing narcotic medicine for this patient.  Call if any problem.  Precautions discussed.  Return in six weeks.  Electronically Signed [place], MD 6/6/20238:54 AM

## 2021-12-16 ENCOUNTER — Other Ambulatory Visit: Payer: Self-pay | Admitting: Orthopaedic Surgery

## 2022-01-03 ENCOUNTER — Telehealth: Payer: Self-pay | Admitting: Orthopaedic Surgery

## 2022-01-03 MED ORDER — HYDROCODONE-ACETAMINOPHEN 7.5-325 MG PO TABS
ORAL_TABLET | ORAL | 0 refills | Status: DC
Start: 2022-01-03 — End: 2022-02-02

## 2022-01-03 NOTE — Telephone Encounter (Signed)
Patient requests refill  HYDROcodone-acetaminophen (NORCO) 7.5-325 MG tablet 50 tablet      [facility]

## 2022-01-10 ENCOUNTER — Ambulatory Visit: Payer: Medicare PPO | Admitting: Orthopaedic Surgery

## 2022-01-10 ENCOUNTER — Encounter: Payer: Self-pay | Admitting: Orthopaedic Surgery

## 2022-01-10 DIAGNOSIS — G8929 Other chronic pain: Secondary | ICD-10-CM | POA: Diagnosis not present

## 2022-01-10 DIAGNOSIS — M25562 Pain in left knee: Secondary | ICD-10-CM | POA: Diagnosis not present

## 2022-01-10 DIAGNOSIS — M25561 Pain in right knee: Secondary | ICD-10-CM | POA: Diagnosis not present

## 2022-01-10 DIAGNOSIS — Z6841 Body Mass Index (BMI) 40.0 and over, adult: Secondary | ICD-10-CM

## 2022-01-10 MED ORDER — METHYLPREDNISOLONE ACETATE 40 MG/ML IJ SUSP
40.0000 mg | Freq: Once | INTRAMUSCULAR | Status: AC
  Administered 2022-01-10 (×2): 40 mg via INTRA_ARTICULAR

## 2022-01-10 MED ORDER — METHYLPREDNISOLONE ACETATE 40 MG/ML IJ SUSP
40.0000 mg | Freq: Once | INTRAMUSCULAR | Status: AC
Start: 2022-01-10 — End: 2022-01-10
  Administered 2022-01-10: 40 mg via INTRA_ARTICULAR

## 2022-01-10 NOTE — Progress Notes (Signed)
PROCEDURE NOTE:  The patient requests injections of the left knee , verbal consent was obtained.  The left knee was prepped appropriately after time out was performed.   Sterile technique was observed and injection of 1 cc of DepoMedrol 40 mg with several cc's of plain xylocaine. Anesthesia was provided by ethyl chloride and a 20-gauge needle was used to inject the knee area. The injection was tolerated well.  A band aid dressing was applied.  The patient was advised to apply ice later today and tomorrow to the injection sight as needed.  PROCEDURE NOTE:  The patient requests injections of the right knee , verbal consent was obtained.  The right knee was prepped appropriately after time out was performed.   Sterile technique was observed and injection of 1 cc of DepoMedrol 40mg  with several cc's of plain xylocaine. Anesthesia was provided by ethyl chloride and a 20-gauge needle was used to inject the knee area. The injection was tolerated well.  A band aid dressing was applied.  The patient was advised to apply ice later today and tomorrow to the injection sight as needed.  Encounter Diagnoses  Name Primary?   Chronic pain of both knees Yes   Body mass index 45.0-49.9, adult (HCC)    Morbid obesity (HCC)    Return in one month.  Call if any problem.  Precautions discussed.  Electronically Signed 11-19-1984, MD 7/18/20238:05 AM

## 2022-01-10 NOTE — Addendum Note (Signed)
Addended byCaffie Damme on: 01/10/2022 08:22 AM   Modules accepted: Orders

## 2022-02-02 ENCOUNTER — Telehealth: Payer: Self-pay

## 2022-02-02 MED ORDER — HYDROCODONE-ACETAMINOPHEN 7.5-325 MG PO TABS: ORAL_TABLET | ORAL | 0 refills | Status: DC

## 2022-02-02 NOTE — Telephone Encounter (Signed)
Hydrocodone-Acetaminophen 7.5/325 MG   Qty 50 Tablets     PATIENT USES [facility]

## 2022-02-07 ENCOUNTER — Encounter: Payer: Self-pay | Admitting: Orthopaedic Surgery

## 2022-02-07 ENCOUNTER — Ambulatory Visit: Payer: Medicare PPO | Admitting: Orthopaedic Surgery

## 2022-02-07 DIAGNOSIS — M25561 Pain in right knee: Secondary | ICD-10-CM

## 2022-02-07 DIAGNOSIS — M25562 Pain in left knee: Secondary | ICD-10-CM

## 2022-02-07 DIAGNOSIS — G8929 Other chronic pain: Secondary | ICD-10-CM | POA: Diagnosis not present

## 2022-02-07 DIAGNOSIS — Z6841 Body Mass Index (BMI) 40.0 and over, adult: Secondary | ICD-10-CM

## 2022-02-07 MED ORDER — METHYLPREDNISOLONE ACETATE 40 MG/ML IJ SUSP
40.0000 mg | Freq: Once | INTRAMUSCULAR | Status: AC
  Administered 2022-02-07: 40 mg via INTRA_ARTICULAR

## 2022-02-07 NOTE — Addendum Note (Signed)
Addended by: Recardo Evangelist A on: 02/07/2022 10:57 AM   Modules accepted: Orders

## 2022-02-07 NOTE — Progress Notes (Signed)
PROCEDURE NOTE:  The patient requests injections of the right knee , verbal consent was obtained.  The right knee was prepped appropriately after time out was performed.   Sterile technique was observed and injection of 1 cc of DepoMedrol 40mg  with several cc's of plain xylocaine. Anesthesia was provided by ethyl chloride and a 20-gauge needle was used to inject the knee area. The injection was tolerated well.  A band aid dressing was applied.  The patient was advised to apply ice later today and tomorrow to the injection sight as needed.  PROCEDURE NOTE:  The patient requests injections of the left knee , verbal consent was obtained.  The left knee was prepped appropriately after time out was performed.   Sterile technique was observed and injection of 1 cc of DepoMedrol 40 mg with several cc's of plain xylocaine. Anesthesia was provided by ethyl chloride and a 20-gauge needle was used to inject the knee area. The injection was tolerated well.  A band aid dressing was applied.  The patient was advised to apply ice later today and tomorrow to the injection sight as needed.  Encounter Diagnoses  Name Primary?   Chronic pain of both knees Yes   Body mass index 45.0-49.9, adult (HCC)    Morbid obesity (HCC)    Return in one month.  Call if any problem.  Precautions discussed.  Electronically Signed 11-19-1984, MD 8/15/20238:09 AM

## 2022-02-16 ENCOUNTER — Other Ambulatory Visit: Payer: Self-pay | Admitting: Orthopedic Surgery

## 2022-03-07 ENCOUNTER — Ambulatory Visit: Payer: Medicare PPO | Admitting: Orthopaedic Surgery

## 2022-03-07 ENCOUNTER — Encounter: Payer: Self-pay | Admitting: Orthopaedic Surgery

## 2022-03-07 DIAGNOSIS — M25561 Pain in right knee: Secondary | ICD-10-CM | POA: Diagnosis not present

## 2022-03-07 DIAGNOSIS — M25562 Pain in left knee: Secondary | ICD-10-CM | POA: Diagnosis not present

## 2022-03-07 DIAGNOSIS — Z6841 Body Mass Index (BMI) 40.0 and over, adult: Secondary | ICD-10-CM

## 2022-03-07 DIAGNOSIS — G8929 Other chronic pain: Secondary | ICD-10-CM

## 2022-03-07 MED ORDER — METHYLPREDNISOLONE ACETATE 40 MG/ML IJ SUSP
40.0000 mg | Freq: Once | INTRAMUSCULAR | Status: AC
  Administered 2022-03-07: 40 mg via INTRA_ARTICULAR

## 2022-03-07 MED ORDER — HYDROCODONE-ACETAMINOPHEN 7.5-325 MG PO TABS: ORAL_TABLET | ORAL | 0 refills | Status: DC

## 2022-03-07 NOTE — Progress Notes (Signed)
PROCEDURE NOTE:  The patient requests injections of the left knee , verbal consent was obtained.  The left knee was prepped appropriately after time out was performed.   Sterile technique was observed and injection of 1 cc of DepoMedrol 40 mg with several cc's of plain xylocaine. Anesthesia was provided by ethyl chloride and a 20-gauge needle was used to inject the knee area. The injection was tolerated well.  A band aid dressing was applied.  The patient was advised to apply ice later today and tomorrow to the injection sight as needed.   PROCEDURE NOTE:  The patient requests injections of the right knee , verbal consent was obtained.  The right knee was prepped appropriately after time out was performed.   Sterile technique was observed and injection of 1 cc of DepoMedrol 40mg  with several cc's of plain xylocaine. Anesthesia was provided by ethyl chloride and a 20-gauge needle was used to inject the knee area. The injection was tolerated well.  A band aid dressing was applied.  The patient was advised to apply ice later today and tomorrow to the injection sight as needed.  Encounter Diagnoses  Name Primary?   Chronic pain of both knees Yes   Body mass index 45.0-49.9, adult (HCC)    Morbid obesity (HCC)    Return in five weeks.  I have reviewed the 11-19-1983 Controlled Substance Reporting System web site prior to prescribing narcotic medicine for this patient.  Call if any problem.  Precautions discussed.  Electronically Signed [place], MD 9/12/20238:18 AM

## 2022-03-07 NOTE — Addendum Note (Signed)
Addended by: Recardo Evangelist A on: 03/07/2022 10:55 AM   Modules accepted: Orders

## 2022-04-06 ENCOUNTER — Other Ambulatory Visit: Payer: Self-pay | Admitting: Orthopaedic Surgery

## 2022-04-11 ENCOUNTER — Encounter: Payer: Self-pay | Admitting: Orthopaedic Surgery

## 2022-04-11 ENCOUNTER — Ambulatory Visit (INDEPENDENT_AMBULATORY_CARE_PROVIDER_SITE_OTHER): Payer: Medicare PPO | Admitting: Orthopaedic Surgery

## 2022-04-11 DIAGNOSIS — M25561 Pain in right knee: Secondary | ICD-10-CM

## 2022-04-11 DIAGNOSIS — G8929 Other chronic pain: Secondary | ICD-10-CM

## 2022-04-11 DIAGNOSIS — Z6841 Body Mass Index (BMI) 40.0 and over, adult: Secondary | ICD-10-CM

## 2022-04-11 DIAGNOSIS — M25562 Pain in left knee: Secondary | ICD-10-CM | POA: Diagnosis not present

## 2022-04-11 MED ORDER — METHYLPREDNISOLONE ACETATE 40 MG/ML IJ SUSP
40.0000 mg | Freq: Once | INTRAMUSCULAR | Status: AC
  Administered 2022-04-11: 40 mg via INTRA_ARTICULAR

## 2022-04-11 NOTE — Progress Notes (Signed)
PROCEDURE NOTE:  The patient requests injections of the right knee , verbal consent was obtained.  The right knee was prepped appropriately after time out was performed.   Sterile technique was observed and injection of 1 cc of DepoMedrol 40mg  with several cc's of plain xylocaine. Anesthesia was provided by ethyl chloride and a 20-gauge needle was used to inject the knee area. The injection was tolerated well.  A band aid dressing was applied.  The patient was advised to apply ice later today and tomorrow to the injection sight as needed.  PROCEDURE NOTE:  The patient requests injections of the left knee , verbal consent was obtained.  The left knee was prepped appropriately after time out was performed.   Sterile technique was observed and injection of 1 cc of DepoMedrol 40 mg with several cc's of plain xylocaine. Anesthesia was provided by ethyl chloride and a 20-gauge needle was used to inject the knee area. The injection was tolerated well.  A band aid dressing was applied.  The patient was advised to apply ice later today and tomorrow to the injection sight as needed.  Encounter Diagnoses  Name Primary?   Chronic pain of both knees Yes   Body mass index 45.0-49.9, adult (HCC)    Morbid obesity ([place])    Return in six weeks.  Call if any problem.  Precautions discussed.  Electronically Signed Sanjuana Kava, MD 10/17/20239:57 AM

## 2022-04-11 NOTE — Addendum Note (Signed)
Addended by: Obie Dredge A on: 04/11/2022 03:41 PM   Modules accepted: Orders

## 2022-04-25 ENCOUNTER — Ambulatory Visit: Payer: Medicare PPO | Admitting: Orthopaedic Surgery

## 2022-04-25 ENCOUNTER — Encounter: Payer: Self-pay | Admitting: Orthopaedic Surgery

## 2022-04-25 DIAGNOSIS — G8929 Other chronic pain: Secondary | ICD-10-CM

## 2022-04-25 DIAGNOSIS — Z6841 Body Mass Index (BMI) 40.0 and over, adult: Secondary | ICD-10-CM | POA: Diagnosis not present

## 2022-04-25 DIAGNOSIS — M25562 Pain in left knee: Secondary | ICD-10-CM

## 2022-04-25 MED ORDER — PREDNISONE 5 MG (21) PO TBPK: ORAL_TABLET | ORAL | 0 refills | Status: DC

## 2022-04-25 MED ORDER — HYDROCODONE-ACETAMINOPHEN 7.5-325 MG PO TABS: ORAL_TABLET | ORAL | 0 refills | Status: DC

## 2022-04-25 NOTE — Addendum Note (Signed)
Addended by: Obie Dredge A on: 04/25/2022 10:30 AM   Modules accepted: Orders

## 2022-04-25 NOTE — Progress Notes (Signed)
My knee is hurting more.  The injections to both knees six weeks ago did not help her.  Her left knee is worse. She has swelling and giving way.  She has popping.  The pain is worse after it gives way.  She has no new trauma.  Both knees have effusion and crepitus, ROM right 0 to 110, left 0 to 105, right knee stable, left knee positive medial pain and medial McMurray.  Limp to the left.  NV intact.  No distal edema.  Encounter Diagnoses  Name Primary?   Chronic pain of left knee Yes   Body mass index 45.0-49.9, adult (HCC)    Morbid obesity ([place])    I will get MRI of the left knee.  I will refill her pain medicine.  I will give prednisone dose pack.  Return in two weeks.  Call if any problem.  Precautions discussed.  Electronically Signed Sanjuana Kava, MD 10/31/202310:27 AM

## 2022-04-25 NOTE — Patient Instructions (Signed)
While we are working on your approval please go ahead and call to schedule your appointment with [facility] today.We need at least 1 week to work on getting prior approval for your imaging. For example, if your appointment is scheduled for the first day of the month, we need you to make your imaging appointment for the 9th day of the month or later.  Central Scheduling [phone]  AFTER you have made your imaging appointment, please call our office back at [phone] to schedule an appointment to review your results. Your follow up appointment will need to be 3-4 days after your imaging is performed to allow time for radiology to read your imaging and for us to get the report. If you are having your imaging performed in a facility not associated with [facility], you will need to ask for a CD with your images on them.   

## 2022-05-08 ENCOUNTER — Other Ambulatory Visit: Payer: Self-pay | Admitting: Orthopedic Surgery

## 2022-05-11 ENCOUNTER — Ambulatory Visit (HOSPITAL_COMMUNITY)
Admission: RE | Admit: 2022-05-11 | Discharge: 2022-05-11 | Disposition: A | Discharge status: Home / Self Care | Payer: Medicare PPO | Source: Ambulatory Visit | Attending: Orthopaedic Surgery | Admitting: Orthopaedic Surgery

## 2022-05-11 DIAGNOSIS — G8929 Other chronic pain: Secondary | ICD-10-CM | POA: Diagnosis present

## 2022-05-11 DIAGNOSIS — M25562 Pain in left knee: Secondary | ICD-10-CM | POA: Insufficient documentation

## 2022-05-17 ENCOUNTER — Ambulatory Visit (HOSPITAL_COMMUNITY): Payer: Medicare PPO

## 2022-05-23 ENCOUNTER — Encounter: Payer: Self-pay | Admitting: Orthopaedic Surgery

## 2022-05-23 ENCOUNTER — Ambulatory Visit: Payer: Medicare PPO | Admitting: Orthopaedic Surgery

## 2022-05-23 DIAGNOSIS — G8929 Other chronic pain: Secondary | ICD-10-CM | POA: Diagnosis not present

## 2022-05-23 DIAGNOSIS — Z6841 Body Mass Index (BMI) 40.0 and over, adult: Secondary | ICD-10-CM | POA: Diagnosis not present

## 2022-05-23 DIAGNOSIS — M25562 Pain in left knee: Secondary | ICD-10-CM

## 2022-05-23 MED ORDER — HYDROCODONE-ACETAMINOPHEN 7.5-325 MG PO TABS: ORAL_TABLET | ORAL | 0 refills | Status: DC

## 2022-05-23 NOTE — Progress Notes (Signed)
My knee hurts bad.  She had the MRI of the left knee showing: IMPRESSION: 1. 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 2. Chronic ACL deficient knee. 3. Significant tricompartmental degenerative changes, most severe in the medial compartment. 4. Small joint effusion and small Baker's cyst. 5. Moderate distal quadriceps tendinopathy.  I have explained the findings to her.  I will have her see Dr. Romeo Apple for further evaluation of the knee.  I have independently reviewed the MRI.    Her left knee is swollen, tender, ROM 0 to 105, crepitus, positive medial McMurray, limp left, no distal edema. NV intact.  Encounter Diagnoses  Name Primary?   Chronic pain of left knee Yes   Body mass index 45.0-49.9, adult (HCC)    Morbid obesity (HCC)    To see Dr. Romeo Apple.  I will refill pain medicine.  Call if any problem.  Precautions discussed.  Electronically Signed Darreld Mclean, MD 11/28/202310:26 AM

## 2022-05-23 NOTE — Patient Instructions (Signed)
SCHEDULE TO SEE DR.HARRISON FOR POSSIBLE SURGERY LT KNEE

## 2022-05-29 ENCOUNTER — Ambulatory Visit: Payer: Medicare PPO | Admitting: Orthopedic Surgery

## 2022-05-29 ENCOUNTER — Ambulatory Visit (INDEPENDENT_AMBULATORY_CARE_PROVIDER_SITE_OTHER): Payer: Medicare PPO

## 2022-05-29 ENCOUNTER — Encounter: Payer: Self-pay | Admitting: Orthopedic Surgery

## 2022-05-29 VITALS — BP 160/100 | HR 96

## 2022-05-29 DIAGNOSIS — M25561 Pain in right knee: Secondary | ICD-10-CM | POA: Diagnosis not present

## 2022-05-29 DIAGNOSIS — G8929 Other chronic pain: Secondary | ICD-10-CM | POA: Diagnosis not present

## 2022-05-29 DIAGNOSIS — M25562 Pain in left knee: Secondary | ICD-10-CM

## 2022-05-29 MED ORDER — PREDNISONE 10 MG (48) PO TBPK: ORAL_TABLET | Freq: Every day | ORAL | 0 refills | Status: DC

## 2022-05-29 MED ORDER — METHYLPREDNISOLONE ACETATE 40 MG/ML IJ SUSP
40.0000 mg | Freq: Once | INTRAMUSCULAR | Status: AC
  Administered 2022-05-29: 40 mg via INTRA_ARTICULAR

## 2022-05-29 NOTE — Progress Notes (Signed)
NEW PROBLEM//OFFICE VISIT   Chief Complaint  Patient presents with   Knee Pain    RT knee/ referred by Dr.Keeling Discuss treatment options   66 year old female with hypertension controlled with medication on chronic opioid therapy hydrocodone presents for evaluation of her left knee at the request of Dr. Hilda Lias after several injections did not improve her pain.  However she says the right knee hurts worse than the left knee.  She did have an MRI of the left knee I have no films of the right or left knee.       ROS: No chest pain or shortness of breath no numbness or tingling in the legs  No Known Allergies  Current Outpatient Medications  Medication Instructions   atorvastatin (LIPITOR) 20 mg, Oral, Daily   hydrochlorothiazide (HYDRODIURIL) 12.5 mg, Oral, Daily   HYDROcodone-acetaminophen (NORCO) 7.5-325 MG tablet TAKE 1 TABLET BY MOUTH EVERY 6 HOURS AS NEEDED FOR PAIN   losartan (COZAAR) 50 MG tablet Oral    ROS   BP (!) 160/100   Pulse 96   SpO2 98%   There is no height or weight on file to calculate BMI.  General appearance: Well-developed well-nourished no gross deformities  Cardiovascular normal pulse and perfusion normal color without edema  Neurologically no sensation loss or deficits or pathologic reflexes  Psychological: Awake alert and oriented x3 mood and affect normal  Skin no lacerations or ulcerations no nodularity no palpable masses, no erythema or nodularity  Musculoskeletal: Right knee the skin looks clean flexion arc is 95 degrees plus the stability is normal but tenderness is primarily on the medial joint line  Left knee similar findings skin is clean flexion arc of 100 degrees stability is normal tenderness is primarily on the medial joint line there is crepitation  Gait is unsupported but she is definitely limping she is favoring the right knee    Past Medical History:  Diagnosis Date   Arthritis    Hypertension     Past Surgical  History:  Procedure Laterality Date   TUBAL LIGATION      Family History  Problem Relation Age of Onset   COPD Mother    Arthritis Mother    Heart attack Mother    Cancer Father    Hypertension Father    Arthritis Sister    Hypertension Sister    Arthritis Brother    Hypertension Brother    Social History   Tobacco Use   Smoking status: Never   Smokeless tobacco: Never    No Known Allergies  Current Meds  Medication Sig   atorvastatin (LIPITOR) 20 MG tablet Take 20 mg by mouth daily.   hydrochlorothiazide (HYDRODIURIL) 12.5 MG tablet Take 12.5 mg by mouth daily.   HYDROcodone-acetaminophen (NORCO) 7.5-325 MG tablet TAKE 1 TABLET BY MOUTH EVERY 6 HOURS AS NEEDED FOR PAIN   losartan (COZAAR) 50 MG tablet Take by mouth.     MEDICAL DECISION MAKING  A.  Encounter Diagnosis  Name Primary?   Chronic pain of left knee Yes    B. DATA ANALYSED:   IMAGING: Interpretation of images: I have personally reviewed the images and my interpretation is new images were taken today of the left knee since that was the one that the consult was requested for.  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 Luna Glasgow   C. MANAGEMENT  Patient needs to continue to lose weight as her BMI is over 40.  She should continue with light exercise.  She agreed to an injection in the right knee.  The next time she comes in we will x-ray her right knee.  She will need a knee replacement once her weight is less than 40 on the BMI and she will need 30 days off of opioids prior to surgery to help with postop pain control   Procedure note right knee injection   verbal consent was obtained to inject right knee joint  Timeout was completed to confirm the site of  injection  The medications used were depomedrol 40 mg and 1% lidocaine 3 cc Anesthesia was provided by ethyl chloride and the skin was prepped with alcohol.  After cleaning the skin with alcohol a 20-gauge needle was used to inject the right knee joint. There were no complications. A sterile bandage was applied. No orders of the defined types were placed in this encounter.    Arther Abbott, MD  05/29/2022 9:05 AM

## 2022-05-29 NOTE — Patient Instructions (Signed)

## 2022-06-22 ENCOUNTER — Other Ambulatory Visit: Payer: Self-pay | Admitting: Orthopaedic Surgery

## 2022-07-05 ENCOUNTER — Other Ambulatory Visit: Payer: Self-pay | Admitting: Orthopedic Surgery

## 2022-07-05 DIAGNOSIS — G8929 Other chronic pain: Secondary | ICD-10-CM

## 2022-07-05 NOTE — Telephone Encounter (Signed)
I called to make sure she does need this refilled since she just got it last end of January, and she does she said she can't even walk across the yard

## 2022-07-05 NOTE — Telephone Encounter (Signed)
She needs to see Dr Luna Glasgow for her back first

## 2022-07-24 ENCOUNTER — Telehealth: Payer: Self-pay

## 2022-07-24 ENCOUNTER — Telehealth: Payer: Self-pay | Admitting: Orthopedic Surgery

## 2022-07-24 ENCOUNTER — Other Ambulatory Visit: Payer: Self-pay | Admitting: Orthopedic Surgery

## 2022-07-24 DIAGNOSIS — G8929 Other chronic pain: Secondary | ICD-10-CM

## 2022-07-24 MED ORDER — HYDROCODONE-ACETAMINOPHEN 7.5-325 MG PO TABS: 1.0000 | ORAL_TABLET | Freq: Four times a day (QID) | ORAL | 0 refills | Status: DC | PRN

## 2022-07-24 NOTE — Telephone Encounter (Signed)
Please see below message from patient. Thank you

## 2022-07-24 NOTE — Addendum Note (Signed)
Addended by: Obie Dredge A on: 07/24/2022 12:23 PM   Modules accepted: Orders

## 2022-07-24 NOTE — Telephone Encounter (Signed)
Patient called to let Dr. Aline Brochure know that she went to her PCP and she is down to 228 lbs and her BMI is 37.   She is also wanting to know if she could get some Prednisone.  PATIENT USES [facility]

## 2022-07-24 NOTE — Addendum Note (Signed)
Addended by: Willette Pa on: 07/24/2022 08:28 PM   Modules accepted: Orders

## 2022-08-01 ENCOUNTER — Other Ambulatory Visit: Payer: Self-pay | Admitting: Orthopedic Surgery

## 2022-08-01 ENCOUNTER — Telehealth: Payer: Self-pay

## 2022-08-01 DIAGNOSIS — G8929 Other chronic pain: Secondary | ICD-10-CM

## 2022-08-01 MED ORDER — PREDNISONE 10 MG (48) PO TBPK: ORAL_TABLET | Freq: Every day | ORAL | 0 refills | Status: DC

## 2022-08-01 NOTE — Telephone Encounter (Signed)
Patient called stating that she can barely walk from the kitchen to the living room. She is asking if she could get some more Prednisone, or something maybe a shot.  PATIENT USES [facility]  Please advise

## 2022-08-01 NOTE — Telephone Encounter (Signed)
She took a prednisone taper in December  Requesting another She is Dr Stacy [name] patient BMI too high for a knee replacement at this point.

## 2022-08-01 NOTE — Progress Notes (Signed)
Encounter Diagnosis  Name Primary?   Chronic pain of both knees     Meds ordered this encounter  Medications   predniSONE (STERAPRED UNI-PAK 48 TAB) 10 MG (48) TBPK tablet    Sig: Take by mouth daily. 10 mg 12 days    Dispense:  48 tablet    Refill:  0

## 2022-08-24 ENCOUNTER — Other Ambulatory Visit: Payer: Self-pay | Admitting: Orthopedic Surgery

## 2022-08-24 NOTE — Telephone Encounter (Signed)
I called patient to advise. Best to be off hydrocodone if she is planning total knee replacement she voiced understanding   I pended refusal but it wont let me refuse it in computer Will you please send the denial

## 2022-08-24 NOTE — Telephone Encounter (Signed)
Did not do surgery yet   I dont think she should have opioids for oa

## 2022-08-29 ENCOUNTER — Ambulatory Visit: Payer: Medicare PPO | Admitting: Orthopaedic Surgery

## 2022-09-04 ENCOUNTER — Encounter: Payer: Self-pay | Admitting: Orthopedic Surgery

## 2022-09-04 ENCOUNTER — Ambulatory Visit (INDEPENDENT_AMBULATORY_CARE_PROVIDER_SITE_OTHER): Payer: Medicare PPO

## 2022-09-04 ENCOUNTER — Ambulatory Visit: Payer: Medicare PPO | Admitting: Orthopedic Surgery

## 2022-09-04 VITALS — BP 214/89 | HR 75 | Ht 64.0 in | Wt 230.2 lb

## 2022-09-04 DIAGNOSIS — M1711 Unilateral primary osteoarthritis, right knee: Secondary | ICD-10-CM | POA: Diagnosis not present

## 2022-09-04 DIAGNOSIS — Z01818 Encounter for other preprocedural examination: Secondary | ICD-10-CM

## 2022-09-04 DIAGNOSIS — G8929 Other chronic pain: Secondary | ICD-10-CM

## 2022-09-04 MED ORDER — BUPIVACAINE-MELOXICAM ER 400-12 MG/14ML IJ SOLN: 400.0000 mg | Freq: Once | INTRAMUSCULAR | Status: DC

## 2022-09-04 NOTE — Progress Notes (Signed)
Chief Complaint  Patient presents with   Knee Pain    Follow up RT>LT    67 year old female with hypertension presents with bilateral knee pain right greater than left she has severe deformity and arthritis both knees right worse than left.  She has failed nonoperative treatment which included weight loss she is gone under BMI of 40, activity modification, injection, oral NSAID therapy  She is interested in proceeding with right total knee arthroplasty  We discussed the risk and benefits of surgery which include but were not limited to infection, blood clot, pulmonary embolism, postop stiffness and arthrofibrosis.  The right knee is in varus the medial side is tender the skin is intact the range of motion is approximately 15 degrees with flexion up to 110 degrees the knee is otherwise stable there is no pseudolaxity.  BP (!) 214/89 Comment: hasn't taken bp med this morning  Pulse 75   Ht '5\' 4"'$  (1.626 m)   Wt 230 lb 3.2 oz (104.4 kg)   BMI 39.51 kg/m   Blood pressure medication was not taken today patient has an appointment Thursday  Patient has 4-5 steps in her house.  She has someone to stay with her after surgery.  Proceed with right total knee arthroplasty  Images were obtained today see report severe arthritis noted with varus and some proximal bone loss will require revision components available  The procedure has been fully reviewed with the patient; The risks and benefits of surgery have been discussed and explained and understood. Alternative treatment has also been reviewed, questions were encouraged and answered. The postoperative plan is also been reviewed.   Encounter Diagnoses  Name Primary?   Chronic pain of right knee    Primary osteoarthritis of right knee Yes

## 2022-09-04 NOTE — Patient Instructions (Addendum)
Your surgery will be at [facility] by Dr Harrison  plan to be in hospital overnight. The hospital will contact you with a preoperative appointment to discuss Anesthesia.  Please arrive on time or 15 minutes early for the preoperative appointment, they have a very tight schedule if you are late or do not come in your surgery will be cancelled.  The phone number for the preop area is [phone]. Please bring your medications with you for the appointment. They will tell you the arrival time for surgery and medication instructions when you have your preoperative evaluation. Do not wear nail polish the day of your surgery and if you take Phentermine you need to stop this medication ONE WEEK prior to your surgery. f you take Invokana, Farxiga, Jardiance, or Steglatro) - Hold 72 hours before the procedure.  If you take Ozempic,  Bydureon or Trulicity do not take for 8 days before your surgery. If you take Victoza, Rybelsis, Saxenda or Adlyxi stop 24 hours before the procedure. Please arrive at the hospital 2 hours before procedure if scheduled at 9:30 or later in the day or at the time the nurse tells you at your preoperative visit.   If you have my chart do not use the time given in my chart use the time given to you by the nurse during your preoperative visit.   Your surgery  time may change. Please be available for phone calls the day of your surgery and the day before. The Short Stay department may need to discuss changes about your surgery time. Not reaching the you could lead to procedure delays and possible cancellation.  You must have a ride home and someone to stay with you for 24 to 48 hours. The person taking you home will receive and sign for the your discharge instructions.  Please be prepared to give your support person's name and telephone number to [facility] [employer]. Dr Harrison will need that name and phone number post procedure.   You will also get a call from a representative of [employer], they have a machine that you will use in the first few weeks after surgery. It is called a CPM.   You will have home physical therapy for 2 weeks after surgery, the home health agency will call you before or just following the surgery to set up visits. [facility] is the agency we normally use, unless you request another agency.   You will get a call also from outpatient therapy for therapy starting when the home therapy is done.  If you have questions or need to Reschedule the surgery, call the office ask for [facility].    You have decided to proceed with knee replacement surgery. You have decided not to continue with nonoperative measures such as but not limited to oral medication, weight loss, activity modification, physical therapy, bracing, or injection.  We will perform the procedure commonly known as total knee replacement. Some of the risks associated with knee replacement surgery include but are not limited to Bleeding Infection Swelling Stiffness Blood clot Pulmonary embolism  Loosening of the implant Pain that persists even after surgery  Infection is especially devastating complication of knee surgery although rare. If infection does occur your implant will usually have to be removed and several surgeries and antibiotics will be needed to eradicate the infection prior to performing a repeat replacement.   In some cases amputation is required to eradicate the infection. In other rare cases a knee fusion is   needed   In compliance with recent [place] law in federal regulation regarding opioid use and abuse and addiction, we will taper (stop) opioid medication after 2 weeks.  If you're not comfortable with these risks and would like to continue with nonoperative treatment please let Dr. Harrison know prior to your surgery.  

## 2022-09-04 NOTE — Addendum Note (Signed)
Addended byCandice Camp on: 09/04/2022 10:53 AM   Modules accepted: Orders

## 2022-09-13 ENCOUNTER — Other Ambulatory Visit: Payer: Self-pay | Admitting: Orthopaedic Surgery

## 2022-09-13 DIAGNOSIS — G8929 Other chronic pain: Secondary | ICD-10-CM

## 2022-09-15 NOTE — Patient Instructions (Signed)
Your procedure is scheduled on: 09/26/2022  Report to [facility] at  6:00   AM.  Call this number if you have problems the morning of surgery: [phone]   Remember:   Do not Eat or Drink after midnight         No Smoking the morning of surgery  :  Take these medicines the morning of surgery with A SIP OF WATER: none   Do not wear jewelry, make-up or nail polish.  Do not wear lotions, powders, or perfumes. You may wear deodorant.  Do not shave 48 hours prior to surgery. Men may shave face and neck.  Do not bring valuables to the hospital.  Contacts, dentures or bridgework may not be worn into surgery.  Leave suitcase in the car. After surgery it may be brought to your room.  For patients admitted to the hospital, checkout time is 11:00 AM the day of discharge.   Patients discharged the day of surgery will not be allowed to drive home.    Special Instructions: Shower using CHG wash Friday, Saturday, Sunday and Monday night before surgery and shower the day of surgery use CHG.  Use special wash - you have one bottle of CHG for all showers.  You should use approximately 1/3 of the bottle for each shower.  How to Use Chlorhexidine Before Surgery Chlorhexidine gluconate (CHG) is a germ-killing (antiseptic) solution that is used to clean the skin. It can get rid of the bacteria that normally live on the skin and can keep them away for about 24 hours. To clean your skin with CHG, you may be given: A CHG solution to use in the shower or as part of a sponge bath. A prepackaged cloth that contains CHG. Cleaning your skin with CHG may help lower the risk for infection: While you are staying in the intensive care unit of the hospital. If you have a vascular access, such as a central line, to provide short-term or long-term access to your veins. If you have a catheter to drain urine from your bladder. If you are on a ventilator. A ventilator is a machine that helps you breathe by  moving air in and out of your lungs. After surgery. What are the risks? Risks of using CHG include: A skin reaction. Hearing loss, if CHG gets in your ears and you have a perforated eardrum. Eye injury, if CHG gets in your eyes and is not rinsed out. The CHG product catching fire. Make sure that you avoid smoking and flames after applying CHG to your skin. Do not use CHG: If you have a chlorhexidine allergy or have previously reacted to chlorhexidine. On babies younger than 74 months of age. How to use CHG solution Use CHG only as told by your health care provider, and follow the instructions on the label. Use the full amount of CHG as directed. Usually, this is one bottle. During a shower Follow these steps when using CHG solution during a shower (unless your health care provider gives you different instructions): Start the shower. Use your normal soap and shampoo to wash your face and hair. Turn off the shower or move out of the shower stream. Pour the CHG onto a clean washcloth. Do not use any type of brush or rough-edged sponge. Starting at your neck, lather your body down to your toes. Make sure you follow these instructions: If you will be having surgery, pay special attention to the part of your body where  you will be having surgery. Scrub this area for at least 1 minute. Do not use CHG on your head or face. If the solution gets into your ears or eyes, rinse them well with water. Avoid your genital area. Avoid any areas of skin that have broken skin, cuts, or scrapes. Scrub your back and under your arms. Make sure to wash skin folds. Let the lather sit on your skin for 1-2 minutes or as long as told by your health care provider. Thoroughly rinse your entire body in the shower. Make sure that all body creases and crevices are rinsed well. Dry off with a clean towel. Do not put any substances on your body afterward--such as powder, lotion, or perfume--unless you are told to do so by  your health care provider. Only use lotions that are recommended by the manufacturer. Put on clean clothes or pajamas. If it is the night before your surgery, sleep in clean sheets.  During a sponge bath Follow these steps when using CHG solution during a sponge bath (unless your health care provider gives you different instructions): Use your normal soap and shampoo to wash your face and hair. Pour the CHG onto a clean washcloth. Starting at your neck, lather your body down to your toes. Make sure you follow these instructions: If you will be having surgery, pay special attention to the part of your body where you will be having surgery. Scrub this area for at least 1 minute. Do not use CHG on your head or face. If the solution gets into your ears or eyes, rinse them well with water. Avoid your genital area. Avoid any areas of skin that have broken skin, cuts, or scrapes. Scrub your back and under your arms. Make sure to wash skin folds. Let the lather sit on your skin for 1-2 minutes or as long as told by your health care provider. Using a different clean, wet washcloth, thoroughly rinse your entire body. Make sure that all body creases and crevices are rinsed well. Dry off with a clean towel. Do not put any substances on your body afterward--such as powder, lotion, or perfume--unless you are told to do so by your health care provider. Only use lotions that are recommended by the manufacturer. Put on clean clothes or pajamas. If it is the night before your surgery, sleep in clean sheets. How to use CHG prepackaged cloths Only use CHG cloths as told by your health care provider, and follow the instructions on the label. Use the CHG cloth on clean, dry skin. Do not use the CHG cloth on your head or face unless your health care provider tells you to. When washing with the CHG cloth: Avoid your genital area. Avoid any areas of skin that have broken skin, cuts, or scrapes. Before  surgery Follow these steps when using a CHG cloth to clean before surgery (unless your health care provider gives you different instructions): Using the CHG cloth, vigorously scrub the part of your body where you will be having surgery. Scrub using a back-and-forth motion for 3 minutes. The area on your body should be completely wet with CHG when you are done scrubbing. Do not rinse. Discard the cloth and let the area air-dry. Do not put any substances on the area afterward, such as powder, lotion, or perfume. Put on clean clothes or pajamas. If it is the night before your surgery, sleep in clean sheets.  For general bathing Follow these steps when using CHG cloths for general  bathing (unless your health care provider gives you different instructions). Use a separate CHG cloth for each area of your body. Make sure you wash between any folds of skin and between your fingers and toes. Wash your body in the following order, switching to a new cloth after each step: The front of your neck, shoulders, and chest. Both of your arms, under your arms, and your hands. Your stomach and groin area, avoiding the genitals. Your right leg and foot. Your left leg and foot. The back of your neck, your back, and your buttocks. Do not rinse. Discard the cloth and let the area air-dry. Do not put any substances on your body afterward--such as powder, lotion, or perfume--unless you are told to do so by your health care provider. Only use lotions that are recommended by the manufacturer. Put on clean clothes or pajamas. Contact a health care provider if: Your skin gets irritated after scrubbing. You have questions about using your solution or cloth. You swallow any chlorhexidine. Call your local poison control center (1-412 164 2815 in the [place] Get help right away if: Your eyes itch badly, or they become very red or swollen. Your skin itches badly and is red or swollen. Your hearing changes. You have trouble  seeing. You have swelling or tingling in your mouth or throat. You have trouble breathing. These symptoms may represent a serious problem that is an emergency. Do not wait to see if the symptoms will go away. Get medical help right away. Call your local emergency services (911 in the [place] Do not drive yourself to the hospital. Summary Chlorhexidine gluconate (CHG) is a germ-killing (antiseptic) solution that is used to clean the skin. Cleaning your skin with CHG may help to lower your risk for infection. You may be given CHG to use for bathing. It may be in a bottle or in a prepackaged cloth to use on your skin. Carefully follow your health care provider's instructions and the instructions on the product label. Do not use CHG if you have a chlorhexidine allergy. Contact your health care provider if your skin gets irritated after scrubbing. This information is not intended to replace advice given to you by your health care provider. Make sure you discuss any questions you have with your health care provider. Document Revised: 10/10/2021 Document Reviewed: 08/23/2020 Elsevier Patient Education  [place].   Total Knee Replacement, Care After After the procedure, it is common to have stiffness and discomfort, or redness, pain, and swelling around your cut from surgery (incision). You may also have a small amount of blood or clear fluid coming from your incision. Follow these instructions at home: Your doctor may give you more instructions. If you have problems, contact your doctor. Medicines Take over-the-counter and prescription medicines only as told by your doctor. If you were prescribed a blood thinner, take it as told by your doctor. If told, take steps to prevent problems with pooping (constipation). You may need to: Drink enough fluid to keep your pee (urine) pale yellow. Take medicines. You will be told what medicines to take. Eat foods that are high in fiber. These include  beans, whole grains, and fresh fruits and vegetables. Limit foods that are high in fat and sugar. These include fried or sweet foods. Incision care  Follow instructions from your doctor about how to take care of your incision. Make sure you: Wash your hands with soap and water for at least 20 seconds before and after you change your bandage.  If you cannot use soap and water, use hand sanitizer. Change your bandage. Leave stitches, staples, or skin glue in place for at least 2 weeks. Leave tape strips alone unless you are told to take them off. You may trim the edges of the tape strips if they curl up. Do not take baths, swim, or use a hot tub until your doctor says it is okay. Check your incision every day for signs of infection. Check for: More redness, swelling, or pain. More fluid or blood. Warmth. Pus or a bad smell. Activity Rest as told by your doctor. Get up to take short walks every 1 to 2 hours. Ask for help if you feel weak or unsteady. Follow instructions from your doctor about: Using a walker, crutches, or a cane. How much body weight you may safely support on your affected leg (weight-bearing restrictions). How to get out of a bed and chair and how to go up and down stairs. A physical therapist will show you how to do this. Do exercises as told by your doctor or physical therapist. Avoid activities that put stress on your knees. These include running, jumping rope, and doing jumping jacks. Do not play contact sports until your doctor says it is okay. Return to your normal activities when your doctor says that it is safe. Managing pain, stiffness, and swelling  If told, put ice on your knee. To do this: Put ice in a plastic bag or use an icing device (cold flow pad). Follow your doctor's directions about how to use the icing device. Place a towel between your skin and the bag or between your skin and the icing device. Leave the ice on for 20 minutes, 2-3 times a day. Take  off the ice if your skin turns bright red. This is very important. If you cannot feel pain, heat, or cold, you have a greater risk of damage to the area. Move your toes often. Raise your leg above the level of your heart while you are sitting or lying down. Use a few pillows to keep your leg straight. Do not put a pillow just under the knee. If the knee is bent for a long time, this may make the knee stiff. Wear elastic knee support as told by your doctor. Safety  To help prevent falls: Keep floors clear of objects you may trip over. Place items that you may need within easy reach. Wear an apron or tool belt with pockets for carrying objects. This leaves your hands free to help with your balance. Do not drive or use machines until your doctor says it is safe. General instructions Wear compression stockings as told by your doctor. Continue with breathing exercises. This helps prevent lung infection. Do not smoke or use any products that contain nicotine or tobacco. If you need help quitting, ask your doctor. If you plan to visit a dentist: Tell your doctor. Ask about things to do before your teeth are cleaned. Tell your dentist about your new joint. Keep all follow-up visits. Contact a doctor if: You have a fever or chills. You have a cough or feel short of breath. Your medicine is not controlling your pain. You have any of these signs of infection around your incision: More redness, swelling, or pain. More fluid or blood. Warmth. Pus or a bad smell. You fall. Get help right away if: You have very bad pain. You have trouble breathing. You have chest pain. You have pain in your calf or leg. You  have redness, swelling, or warmth in your calf or leg. Your incision breaks open after the stitches or staples are taken out. These symptoms may be an emergency. Get help right away. Call your local emergency services (911 in the [place] Do not wait to see if the symptoms will go away. Do  not drive yourself to the hospital. Summary After the procedure, it is common to have stiffness and discomfort, or redness, pain, and swelling around your incision. Follow instructions from your doctor about how to take care of your incision. Use crutches, a walker, or a cane as told by your doctor. If you were prescribed a blood thinner, take it as told by your doctor. Keep all follow-up visits. This information is not intended to replace advice given to you by your health care provider. Make sure you discuss any questions you have with your health care provider. Document Revised: 12/02/2019 Document Reviewed: 12/02/2019 Elsevier Patient Education  [place] Anesthesia, Adult, Care After The following information offers guidance on how to care for yourself after your procedure. Your health care provider may also give you more specific instructions. If you have problems or questions, contact your health care provider. What can I expect after the procedure? After the procedure, it is common for people to: Have pain or discomfort at the IV site. Have nausea or vomiting. Have a sore throat or hoarseness. Have trouble concentrating. Feel cold or chills. Feel weak, sleepy, or tired (fatigue). Have soreness and body aches. These can affect parts of the body that were not involved in surgery. Follow these instructions at home: For the time period you were told by your health care provider:  Rest. Do not participate in activities where you could fall or become injured. Do not drive or use machinery. Do not drink alcohol. Do not take sleeping pills or medicines that cause drowsiness. Do not make important decisions or sign legal documents. Do not take care of children on your own. General instructions Drink enough fluid to keep your urine pale yellow. If you have sleep apnea, surgery and certain medicines can increase your risk for breathing problems. Follow instructions  from your health care provider about wearing your sleep device: Anytime you are sleeping, including during daytime naps. While taking prescription pain medicines, sleeping medicines, or medicines that make you drowsy. Return to your normal activities as told by your health care provider. Ask your health care provider what activities are safe for you. Take over-the-counter and prescription medicines only as told by your health care provider. Do not use any products that contain nicotine or tobacco. These products include cigarettes, chewing tobacco, and vaping devices, such as e-cigarettes. These can delay incision healing after surgery. If you need help quitting, ask your health care provider. Contact a health care provider if: You have nausea or vomiting that does not get better with medicine. You vomit every time you eat or drink. You have pain that does not get better with medicine. You cannot urinate or have bloody urine. You develop a skin rash. You have a fever. Get help right away if: You have trouble breathing. You have chest pain. You vomit blood. These symptoms may be an emergency. Get help right away. Call 911. Do not wait to see if the symptoms will go away. Do not drive yourself to the hospital. Summary After the procedure, it is common to have a sore throat, hoarseness, nausea, vomiting, or to feel weak, sleepy, or fatigue. For the time period  you were told by your health care provider, do not drive or use machinery. Get help right away if you have difficulty breathing, have chest pain, or vomit blood. These symptoms may be an emergency. This information is not intended to replace advice given to you by your health care provider. Make sure you discuss any questions you have with your health care provider. Document Revised: 09/09/2021 Document Reviewed: 09/09/2021 Elsevier Patient Education  [place] Anesthesia, Adult, Care After After the procedure,  it is common to have: Nausea or vomiting. Itching. Numbness. Shivering. You may feel cold. Follow these instructions at home: Safety For the time period you were told by the health care provider: Rest. Do not participate in activities where you could fall or become injured. Do not drink alcohol. Do not take sleeping pills or medicines that cause drowsiness. Do not make important decisions or sign legal documents. Do not take care of children on your own. Do not drive or operate machinery. Medicines  Take over-the-counter and prescription medicines only as told by your health care provider. If you were prescribed antibiotics, take them as told by your health care provider. Do not stop using the antibiotic even if you start to feel better. Eating and drinking Follow instructions from your health care provider about what you may eat and drink. Drink enough fluid to keep your pee (urine) pale yellow. General instructions  Do not use any products that contain nicotine or tobacco. These products include cigarettes, chewing tobacco, and vaping devices, such as e-cigarettes. If you need help quitting, ask your health care provider. Return to your normal activities as told by your health care provider. Ask your health care provider what activities are safe for you. Your health care provider may give you more instructions. Make sure you know what you can and cannot do Contact a health care provider if: You have nausea and vomiting that last for more than one day. You develop a rash. You have trouble peeing. You have a fever. You get a headache. You have redness, swelling, or pain around your injection site. Get help right away if: You have bleeding from the injection site or bleeding under the skin at the injection site. You have new numbness or weakness. These symptoms may be an emergency. Get help right away. Call 911. Do not wait to see if the symptoms will go away. Do not drive  yourself to the hospital. This information is not intended to replace advice given to you by your health care provider. Make sure you discuss any questions you have with your health care provider. Document Revised: 12/30/2021 Document Reviewed: 12/30/2021 Elsevier Patient Education  [place].  How to Use an Chiropodist An incentive spirometer is a tool that measures how well you are filling your lungs with each breath. Learning to take long, deep breaths using this tool can help you keep your lungs clear and active. This may help to reverse or lessen your chance of developing breathing (pulmonary) problems, especially infection. You may be asked to use a spirometer: After a surgery. If you have a lung problem or a history of smoking. After a long period of time when you have been unable to move or be active. If the spirometer includes an indicator to show the highest number that you have reached, your health care provider or respiratory therapist will help you set a goal. Keep a log of your progress as told by your health care provider.  What are the risks? Breathing too quickly may cause dizziness or cause you to pass out. Take your time so you do not get dizzy or light-headed. If you are in pain, you may need to take pain medicine before doing incentive spirometry. It is harder to take a deep breath if you are having pain. How to use your incentive spirometer  Sit up on the edge of your bed or on a chair. Hold the incentive spirometer so that it is in an upright position. Before you use the spirometer, breathe out normally. Place the mouthpiece in your mouth. Make sure your lips are closed tightly around it. Breathe in slowly and as deeply as you can through your mouth, causing the piston or the ball to rise toward the top of the chamber. Hold your breath for 3-5 seconds, or for as long as possible. If the spirometer includes a coach indicator, use this to guide you in  breathing. Slow down your breathing if the indicator goes above the marked areas. Remove the mouthpiece from your mouth and breathe out normally. The piston or ball will return to the bottom of the chamber. Rest for a few seconds, then repeat the steps 10 or more times. Take your time and take a few normal breaths between deep breaths so that you do not get dizzy or light-headed. Do this every 1-2 hours when you are awake. If the spirometer includes a goal marker to show the highest number you have reached (best effort), use this as a goal to work toward during each repetition. After each set of 10 deep breaths, cough a few times. This will help to make sure that your lungs are clear. If you have an incision on your chest or abdomen from surgery, place a pillow or a rolled-up towel firmly against the incision when you cough. This can help to reduce pain while taking deep breaths and coughing. General tips When you are able to get out of bed: Walk around often. Continue to take deep breaths and cough in order to clear your lungs. Keep using the incentive spirometer until your health care provider says it is okay to stop using it. If you have been in the hospital, you may be told to keep using the spirometer at home. Contact a health care provider if: You are having difficulty using the spirometer. You have trouble using the spirometer as often as instructed. Your pain medicine is not giving enough relief for you to use the spirometer as told. You have a fever. Get help right away if: You develop shortness of breath. You develop a cough with bloody mucus from the lungs. You have fluid or blood coming from an incision site after you cough. Summary An incentive spirometer is a tool that can help you learn to take long, deep breaths to keep your lungs clear and active. You may be asked to use a spirometer after a surgery, if you have a lung problem or a history of smoking, or if you have been  inactive for a long period of time. Use your incentive spirometer as instructed every 1-2 hours while you are awake. If you have an incision on your chest or abdomen, place a pillow or a rolled-up towel firmly against your incision when you cough. This will help to reduce pain. Get help right away if you have shortness of breath, you cough up bloody mucus, or blood comes from your incision when you cough. This information is not intended to replace  advice given to you by your health care provider. Make sure you discuss any questions you have with your health care provider. Document Revised: 09/01/2019 Document Reviewed: 09/01/2019 Elsevier Patient Education  [place].

## 2022-09-18 ENCOUNTER — Telehealth: Payer: Self-pay

## 2022-09-18 ENCOUNTER — Other Ambulatory Visit: Payer: Self-pay | Admitting: Orthopedic Surgery

## 2022-09-18 ENCOUNTER — Encounter (HOSPITAL_COMMUNITY)
Admission: RE | Admit: 2022-09-18 | Discharge: 2022-09-18 | Disposition: A | Discharge status: Home / Self Care | Payer: Medicare PPO | Source: Ambulatory Visit | Attending: Orthopedic Surgery | Admitting: Orthopedic Surgery

## 2022-09-18 ENCOUNTER — Encounter (HOSPITAL_COMMUNITY): Payer: Self-pay

## 2022-09-18 VITALS — BP 138/97 | HR 91 | Temp 99.0°F | Resp 18 | Wt 230.0 lb

## 2022-09-18 DIAGNOSIS — G8929 Other chronic pain: Secondary | ICD-10-CM

## 2022-09-18 DIAGNOSIS — M1711 Unilateral primary osteoarthritis, right knee: Secondary | ICD-10-CM | POA: Diagnosis not present

## 2022-09-18 DIAGNOSIS — Z01818 Encounter for other preprocedural examination: Secondary | ICD-10-CM | POA: Insufficient documentation

## 2022-09-18 HISTORY — DX: Chronic kidney disease, unspecified: N18.9

## 2022-09-18 LAB — CBC WITH DIFFERENTIAL/PLATELET
Abs Immature Granulocytes: 0.04 10*3/uL (ref 0.00–0.07)
Basophils Absolute: 0 10*3/uL (ref 0.0–0.1)
Basophils Relative: 0 %
Eosinophils Absolute: 0.2 10*3/uL (ref 0.0–0.5)
Eosinophils Relative: 2 %
HCT: 44.2 % (ref 36.0–46.0)
Hemoglobin: 13.3 g/dL (ref 12.0–15.0)
Immature Granulocytes: 1 %
Lymphocytes Relative: 34 %
Lymphs Abs: 3 10*3/uL (ref 0.7–4.0)
MCH: 26.7 pg (ref 26.0–34.0)
MCHC: 30.1 g/dL (ref 30.0–36.0)
MCV: 88.8 fL (ref 80.0–100.0)
Monocytes Absolute: 0.7 10*3/uL (ref 0.1–1.0)
Monocytes Relative: 8 %
Neutro Abs: 5 10*3/uL (ref 1.7–7.7)
Neutrophils Relative %: 55 %
Platelets: 197 10*3/uL (ref 150–400)
RBC: 4.98 MIL/uL (ref 3.87–5.11)
RDW: 19.2 % — ABNORMAL HIGH (ref 11.5–15.5)
WBC: 8.9 10*3/uL (ref 4.0–10.5)
nRBC: 0 % (ref 0.0–0.2)

## 2022-09-18 LAB — BASIC METABOLIC PANEL
Anion gap: 9 (ref 5–15)
BUN: 38 mg/dL — ABNORMAL HIGH (ref 8–23)
CO2: 22 mmol/L (ref 22–32)
Calcium: 8.9 mg/dL (ref 8.9–10.3)
Chloride: 109 mmol/L (ref 98–111)
Creatinine, Ser: 1.56 mg/dL — ABNORMAL HIGH (ref 0.44–1.00)
GFR, Estimated: 36 mL/min — ABNORMAL LOW (ref >60)
Glucose, Bld: 87 mg/dL (ref 70–99)
Potassium: 3.8 mmol/L (ref 3.5–5.1)
Sodium: 140 mmol/L (ref 135–145)

## 2022-09-18 LAB — SURGICAL PCR SCREEN
MRSA, PCR: NEGATIVE
Staphylococcus aureus: NEGATIVE

## 2022-09-18 LAB — PREPARE RBC (CROSSMATCH)

## 2022-09-18 MED ORDER — PREDNISONE 10 MG (48) PO TBPK: ORAL_TABLET | Freq: Every day | ORAL | 0 refills | Status: DC

## 2022-09-18 NOTE — Telephone Encounter (Signed)
Patient called stating that her right knee is real sore and wanted to see if she could get some prednisone for a few days.  PATIENT USES [facility]

## 2022-09-18 NOTE — Progress Notes (Signed)
Meds ordered this encounter  Medications   predniSONE (STERAPRED UNI-PAK 48 TAB) 10 MG (48) TBPK tablet    Sig: Take by mouth daily. 10 mg 12 days    Dispense:  48 tablet    Refill:  0

## 2022-09-19 ENCOUNTER — Encounter (HOSPITAL_COMMUNITY): Admission: RE | Admit: 2022-09-19 | Payer: Medicare PPO | Source: Ambulatory Visit

## 2022-09-20 ENCOUNTER — Other Ambulatory Visit: Payer: Self-pay | Admitting: Orthopedic Surgery

## 2022-09-20 ENCOUNTER — Telehealth: Payer: Self-pay | Admitting: Radiology

## 2022-09-20 DIAGNOSIS — M1711 Unilateral primary osteoarthritis, right knee: Secondary | ICD-10-CM

## 2022-09-20 NOTE — Telephone Encounter (Signed)
We discussed plan for surgery physcial therapy since she is in [place] home health orders to [facility] outpatient orders to follow after home health is over to [place]  She has a walker/ CPM orders sent as usual will ck with Ovid Curd to make sure they can go to [place]   To you FYI

## 2022-09-25 NOTE — H&P (Signed)
Chief Complaint  Patient presents with   Knee Pain      Follow up RT>LT      67 year old female with hypertension presents with bilateral knee pain right greater than left she has severe deformity and arthritis both knees right worse than left.  She has failed nonoperative treatment which included weight loss she HAS  gone under BMI of 40, activity modification, injection, oral NSAID therapy   She is interested in proceeding with right total knee arthroplasty   We discussed the risk and benefits of surgery which include but were not limited to infection, blood clot, pulmonary embolism, postop stiffness and arthrofibrosis.  Past Medical History:  Diagnosis Date   Arthritis    Chronic kidney disease    Hypertension    Past Surgical History:  Procedure Laterality Date   CHOLECYSTECTOMY     TUBAL LIGATION     Social History   Socioeconomic History   Marital status: Married    Spouse name: Not on file   Number of children: Not on file   Years of education: Not on file   Highest education level: Not on file  Occupational History   Not on file  Tobacco Use   Smoking status: Never   Smokeless tobacco: Never  Substance and Sexual Activity   Alcohol use: Not Currently   Drug use: Never   Sexual activity: Not on file  Other Topics Concern   Not on file  Social History Narrative   Not on file   Social Determinants of Health   Financial Resource Strain: Not on file  Food Insecurity: Not on file  Transportation Needs: Not on file  Physical Activity: Not on file  Stress: Not on file  Social Connections: Not on file  Intimate Partner Violence: Not on file   Family History  Problem Relation Age of Onset   COPD Mother    Arthritis Mother    Heart attack Mother    Cancer Father    Hypertension Father    Arthritis Sister    Hypertension Sister    Arthritis Brother    Hypertension Brother    Current Outpatient Medications  Medication Instructions   acetaminophen  (TYLENOL) 1,000 mg, Oral, Every 6 hours PRN   atorvastatin (LIPITOR) 20 mg, Oral, Daily   bismuth subsalicylate (PEPTO BISMOL) 524 mg, Oral, As needed   diclofenac (VOLTAREN) 75 mg, Oral, 2 times daily PRN   ferrous sulfate 325 mg, Oral, Daily   hydrochlorothiazide (HYDRODIURIL) 25 mg, Oral, Daily   HYDROcodone-acetaminophen (NORCO) 7.5-325 MG tablet 1 tablet, Oral, Every 6 hours PRN, for pain   losartan (COZAAR) 50 mg, Oral, Daily   Phenylephrine-APAP-guaiFENesin (TYLENOL SINUS SEVERE PO) 2 tablets, Oral, Daily PRN   predniSONE (STERAPRED UNI-PAK 48 TAB) 10 MG (48) TBPK tablet Oral, Daily, 10 mg 12 days   Vitamin D (Ergocalciferol) (DRISDOL) 50,000 Units, Oral, Every Wed    Physical Exam Vitals and nursing note reviewed.  Constitutional:      Appearance: Normal appearance.  HENT:     Head: Normocephalic and atraumatic.  Eyes:     General: No scleral icterus.       Right eye: No discharge.        Left eye: No discharge.     Extraocular Movements: Extraocular movements intact.     Conjunctiva/sclera: Conjunctivae normal.     Pupils: Pupils are equal, round, and reactive to light.  Cardiovascular:     Rate and Rhythm: Normal rate.  Pulses: Normal pulses.  Skin:    General: Skin is warm and dry.     Capillary Refill: Capillary refill takes less than 2 seconds.  Neurological:     General: No focal deficit present.     Mental Status: She is alert and oriented to person, place, and time.  Psychiatric:        Mood and Affect: Mood normal.        Behavior: Behavior normal.        Thought Content: Thought content normal.        Judgment: Judgment normal.       The right knee is in varus the medial side is tender the skin is intact the range of motion is approximately 15 degrees with flexion up to 110 degrees the knee is otherwise stable there is no pseudolaxity.   BP (!) 214/89 Comment: hasn't taken bp med this morning  Pulse 75   Ht 5\' 4"  (1.626 m)   Wt 230 lb 3.2 oz  (104.4 kg)   BMI 39.51 kg/m    Blood pressure medication was not taken today patient has an appointment Thursday   Patient has 4-5 steps in her house.  She has someone to stay with her after surgery.   Proceed with right total knee arthroplasty   Images were obtained today see report severe arthritis noted with varus and some proximal bone loss will require revision components available   The procedure has been fully reviewed with the patient; The risks and benefits of surgery have been discussed and explained and understood. Alternative treatment has also been reviewed, questions were encouraged and answered. The postoperative plan is also been reviewed.        Encounter Diagnoses  Name Primary?   Chronic pain of right knee     Primary osteoarthritis of right knee Yes

## 2022-09-26 ENCOUNTER — Other Ambulatory Visit: Payer: Self-pay

## 2022-09-26 ENCOUNTER — Encounter (HOSPITAL_COMMUNITY): Payer: Self-pay | Admitting: Orthopedic Surgery

## 2022-09-26 ENCOUNTER — Encounter (HOSPITAL_COMMUNITY)
Admission: RE | Disposition: A | Discharge status: Home Health | Payer: Self-pay | Source: Home / Self Care | Attending: Orthopedic Surgery

## 2022-09-26 ENCOUNTER — Ambulatory Visit (HOSPITAL_COMMUNITY): Payer: Medicare PPO

## 2022-09-26 ENCOUNTER — Observation Stay (HOSPITAL_COMMUNITY)
Admission: RE | Admit: 2022-09-26 | Discharge: 2022-09-28 | Disposition: A | Discharge status: Home Health | Payer: Medicare PPO | Attending: Orthopedic Surgery | Admitting: Orthopedic Surgery

## 2022-09-26 ENCOUNTER — Ambulatory Visit (HOSPITAL_BASED_OUTPATIENT_CLINIC_OR_DEPARTMENT_OTHER): Payer: Medicare PPO

## 2022-09-26 DIAGNOSIS — N189 Chronic kidney disease, unspecified: Secondary | ICD-10-CM | POA: Diagnosis not present

## 2022-09-26 DIAGNOSIS — M1711 Unilateral primary osteoarthritis, right knee: Principal | ICD-10-CM | POA: Diagnosis present

## 2022-09-26 DIAGNOSIS — D649 Anemia, unspecified: Secondary | ICD-10-CM | POA: Diagnosis not present

## 2022-09-26 DIAGNOSIS — N289 Disorder of kidney and ureter, unspecified: Secondary | ICD-10-CM | POA: Diagnosis not present

## 2022-09-26 DIAGNOSIS — Z79899 Other long term (current) drug therapy: Secondary | ICD-10-CM | POA: Insufficient documentation

## 2022-09-26 DIAGNOSIS — I10 Essential (primary) hypertension: Secondary | ICD-10-CM | POA: Diagnosis not present

## 2022-09-26 DIAGNOSIS — I129 Hypertensive chronic kidney disease with stage 1 through stage 4 chronic kidney disease, or unspecified chronic kidney disease: Secondary | ICD-10-CM | POA: Diagnosis not present

## 2022-09-26 DIAGNOSIS — Z01818 Encounter for other preprocedural examination: Secondary | ICD-10-CM

## 2022-09-26 HISTORY — PX: TOTAL KNEE ARTHROPLASTY: SHX125

## 2022-09-26 LAB — ABO/RH: ABO/RH(D): O POS

## 2022-09-26 SURGERY — ARTHROPLASTY, KNEE, TOTAL
Anesthesia: Spinal | Site: Knee | Laterality: Right

## 2022-09-26 MED ORDER — ONDANSETRON HCL 4 MG/2ML IJ SOLN: 4.0000 mg | Freq: Once | INTRAMUSCULAR | Status: DC | PRN

## 2022-09-26 MED ORDER — METOCLOPRAMIDE HCL 5 MG/ML IJ SOLN: 5.0000 mg | Freq: Three times a day (TID) | INTRAMUSCULAR | Status: DC | PRN

## 2022-09-26 MED ORDER — KETOROLAC TROMETHAMINE 30 MG/ML IJ SOLN
15.0000 mg | Freq: Once | INTRAMUSCULAR | Status: AC
  Administered 2022-09-26: 15 mg via INTRAVENOUS
  Filled 2022-09-26: qty 1

## 2022-09-26 MED ORDER — HYDROCODONE-ACETAMINOPHEN 5-325 MG PO TABS
1.0000 | ORAL_TABLET | Freq: Once | ORAL | Status: AC
  Administered 2022-09-26: 1 via ORAL
  Filled 2022-09-26: qty 1

## 2022-09-26 MED ORDER — LOSARTAN POTASSIUM 50 MG PO TABS
50.0000 mg | ORAL_TABLET | Freq: Every day | ORAL | Status: DC
  Administered 2022-09-26 – 2022-09-28 (×3): 50 mg via ORAL
  Filled 2022-09-26 (×3): qty 1

## 2022-09-26 MED ORDER — METOCLOPRAMIDE HCL 5 MG PO TABS: 5.0000 mg | ORAL_TABLET | Freq: Three times a day (TID) | ORAL | Status: DC | PRN

## 2022-09-26 MED ORDER — ONDANSETRON HCL 4 MG/2ML IJ SOLN
INTRAMUSCULAR | Status: AC
  Filled 2022-09-26: qty 2

## 2022-09-26 MED ORDER — ASPIRIN 81 MG PO CHEW
81.0000 mg | CHEWABLE_TABLET | Freq: Two times a day (BID) | ORAL | Status: DC
  Administered 2022-09-26 – 2022-09-28 (×4): 81 mg via ORAL
  Filled 2022-09-26 (×4): qty 1

## 2022-09-26 MED ORDER — TRANEXAMIC ACID-NACL 1000-0.7 MG/100ML-% IV SOLN
1000.0000 mg | Freq: Once | INTRAVENOUS | Status: AC
  Administered 2022-09-26: 1000 mg via INTRAVENOUS
  Filled 2022-09-26: qty 100

## 2022-09-26 MED ORDER — MORPHINE SULFATE (PF) 2 MG/ML IV SOLN: 0.5000 mg | INTRAVENOUS | Status: DC | PRN

## 2022-09-26 MED ORDER — MIDAZOLAM HCL 5 MG/5ML IJ SOLN
INTRAMUSCULAR | Status: DC | PRN
  Administered 2022-09-26: 2 mg via INTRAVENOUS

## 2022-09-26 MED ORDER — PROPOFOL 500 MG/50ML IV EMUL
INTRAVENOUS | Status: AC
  Filled 2022-09-26: qty 50

## 2022-09-26 MED ORDER — ONDANSETRON HCL 4 MG/2ML IJ SOLN: 4.0000 mg | Freq: Four times a day (QID) | INTRAMUSCULAR | Status: DC | PRN

## 2022-09-26 MED ORDER — ROPIVACAINE HCL 5 MG/ML IJ SOLN
INTRAMUSCULAR | Status: AC
  Filled 2022-09-26: qty 30

## 2022-09-26 MED ORDER — FENTANYL CITRATE PF 50 MCG/ML IJ SOSY
PREFILLED_SYRINGE | INTRAMUSCULAR | Status: AC
  Filled 2022-09-26: qty 1

## 2022-09-26 MED ORDER — ONDANSETRON HCL 4 MG PO TABS: 4.0000 mg | ORAL_TABLET | Freq: Four times a day (QID) | ORAL | Status: DC | PRN

## 2022-09-26 MED ORDER — CEFAZOLIN SODIUM-DEXTROSE 2-4 GM/100ML-% IV SOLN
2.0000 g | Freq: Four times a day (QID) | INTRAVENOUS | Status: AC
  Administered 2022-09-26 (×2): 2 g via INTRAVENOUS
  Filled 2022-09-26 (×2): qty 100

## 2022-09-26 MED ORDER — METHOCARBAMOL 500 MG PO TABS
500.0000 mg | ORAL_TABLET | Freq: Four times a day (QID) | ORAL | Status: DC | PRN
  Administered 2022-09-27: 500 mg via ORAL
  Filled 2022-09-26: qty 1

## 2022-09-26 MED ORDER — PHENOL 1.4 % MT LIQD: 1.0000 | OROMUCOSAL | Status: DC | PRN

## 2022-09-26 MED ORDER — FENTANYL CITRATE (PF) 100 MCG/2ML IJ SOLN
INTRAMUSCULAR | Status: AC
  Filled 2022-09-26: qty 2

## 2022-09-26 MED ORDER — DOCUSATE SODIUM 100 MG PO CAPS
100.0000 mg | ORAL_CAPSULE | Freq: Two times a day (BID) | ORAL | Status: DC
  Administered 2022-09-26 – 2022-09-28 (×4): 100 mg via ORAL
  Filled 2022-09-26 (×5): qty 1

## 2022-09-26 MED ORDER — ORAL CARE MOUTH RINSE: 15.0000 mL | Freq: Once | OROMUCOSAL | Status: AC

## 2022-09-26 MED ORDER — SODIUM CHLORIDE 0.9 % IV SOLN: INTRAVENOUS | Status: DC

## 2022-09-26 MED ORDER — TRANEXAMIC ACID-NACL 1000-0.7 MG/100ML-% IV SOLN
1000.0000 mg | INTRAVENOUS | Status: AC
  Administered 2022-09-26: 1000 mg via INTRAVENOUS
  Filled 2022-09-26: qty 100

## 2022-09-26 MED ORDER — CEFAZOLIN SODIUM-DEXTROSE 2-4 GM/100ML-% IV SOLN
2.0000 g | INTRAVENOUS | Status: AC
  Administered 2022-09-26: 2 g via INTRAVENOUS
  Filled 2022-09-26: qty 100

## 2022-09-26 MED ORDER — VITAMIN D (ERGOCALCIFEROL) 1.25 MG (50000 UNIT) PO CAPS: 50000.0000 [IU] | ORAL_CAPSULE | ORAL | Status: DC

## 2022-09-26 MED ORDER — CHLORHEXIDINE GLUCONATE 0.12 % MT SOLN
15.0000 mL | Freq: Once | OROMUCOSAL | Status: AC
  Administered 2022-09-26: 15 mL via OROMUCOSAL

## 2022-09-26 MED ORDER — 0.9 % SODIUM CHLORIDE (POUR BTL) OPTIME
TOPICAL | Status: DC | PRN
  Administered 2022-09-26: 1000 mL

## 2022-09-26 MED ORDER — ACETAMINOPHEN 500 MG PO TABS
500.0000 mg | ORAL_TABLET | Freq: Once | ORAL | Status: AC
  Administered 2022-09-26: 500 mg via ORAL
  Filled 2022-09-26: qty 1

## 2022-09-26 MED ORDER — PROPOFOL 10 MG/ML IV BOLUS
INTRAVENOUS | Status: AC
  Filled 2022-09-26: qty 20

## 2022-09-26 MED ORDER — ACETAMINOPHEN 325 MG PO TABS: 325.0000 mg | ORAL_TABLET | Freq: Four times a day (QID) | ORAL | Status: DC | PRN

## 2022-09-26 MED ORDER — POLYETHYLENE GLYCOL 3350 17 G PO PACK: 17.0000 g | PACK | Freq: Every day | ORAL | Status: DC | PRN

## 2022-09-26 MED ORDER — HYDROCODONE-ACETAMINOPHEN 7.5-325 MG PO TABS
1.0000 | ORAL_TABLET | ORAL | Status: DC | PRN
  Administered 2022-09-26: 1 via ORAL
  Administered 2022-09-27: 2 via ORAL
  Filled 2022-09-26: qty 2
  Filled 2022-09-26: qty 1

## 2022-09-26 MED ORDER — TRAMADOL HCL 50 MG PO TABS
50.0000 mg | ORAL_TABLET | Freq: Four times a day (QID) | ORAL | Status: DC
  Administered 2022-09-26 – 2022-09-28 (×9): 50 mg via ORAL
  Filled 2022-09-26 (×9): qty 1

## 2022-09-26 MED ORDER — LACTATED RINGERS IV SOLN: INTRAVENOUS | Status: DC

## 2022-09-26 MED ORDER — OXYCODONE HCL 5 MG PO TABS: 5.0000 mg | ORAL_TABLET | Freq: Once | ORAL | Status: DC | PRN

## 2022-09-26 MED ORDER — METHOCARBAMOL 1000 MG/10ML IJ SOLN: 500.0000 mg | Freq: Four times a day (QID) | INTRAVENOUS | Status: DC | PRN

## 2022-09-26 MED ORDER — HYDROCHLOROTHIAZIDE 25 MG PO TABS
25.0000 mg | ORAL_TABLET | Freq: Every day | ORAL | Status: DC
  Administered 2022-09-26 – 2022-09-28 (×3): 25 mg via ORAL
  Filled 2022-09-26 (×3): qty 1

## 2022-09-26 MED ORDER — ACETAMINOPHEN 500 MG PO TABS
500.0000 mg | ORAL_TABLET | Freq: Four times a day (QID) | ORAL | Status: AC
  Administered 2022-09-26 – 2022-09-27 (×3): 500 mg via ORAL
  Filled 2022-09-26 (×4): qty 1

## 2022-09-26 MED ORDER — FENTANYL CITRATE PF 50 MCG/ML IJ SOSY
25.0000 ug | PREFILLED_SYRINGE | INTRAMUSCULAR | Status: DC | PRN
  Administered 2022-09-26 (×2): 50 ug via INTRAVENOUS
  Filled 2022-09-26: qty 1

## 2022-09-26 MED ORDER — FENTANYL CITRATE (PF) 100 MCG/2ML IJ SOLN
INTRAMUSCULAR | Status: DC | PRN
  Administered 2022-09-26 (×2): 25 ug via INTRAVENOUS

## 2022-09-26 MED ORDER — ONDANSETRON HCL 4 MG/2ML IJ SOLN
INTRAMUSCULAR | Status: DC | PRN
  Administered 2022-09-26: 4 mg via INTRAVENOUS

## 2022-09-26 MED ORDER — MIDAZOLAM HCL 2 MG/2ML IJ SOLN
INTRAMUSCULAR | Status: AC
  Filled 2022-09-26: qty 2

## 2022-09-26 MED ORDER — BUPIVACAINE HCL (PF) 0.5 % IJ SOLN
INTRAMUSCULAR | Status: DC | PRN
  Administered 2022-09-26: 30 mL

## 2022-09-26 MED ORDER — OXYCODONE HCL 5 MG/5ML PO SOLN: 5.0000 mg | Freq: Once | ORAL | Status: DC | PRN

## 2022-09-26 MED ORDER — SURGIRINSE WOUND IRRIGATION SYSTEM - OPTIME
TOPICAL | Status: DC | PRN
  Administered 2022-09-26: 450 mL via TOPICAL

## 2022-09-26 MED ORDER — HYDROCODONE-ACETAMINOPHEN 5-325 MG PO TABS
1.0000 | ORAL_TABLET | ORAL | Status: DC | PRN
  Administered 2022-09-27 – 2022-09-28 (×2): 2 via ORAL
  Filled 2022-09-26 (×2): qty 2

## 2022-09-26 MED ORDER — DEXAMETHASONE SODIUM PHOSPHATE 10 MG/ML IJ SOLN
10.0000 mg | Freq: Once | INTRAMUSCULAR | Status: AC
  Administered 2022-09-27: 10 mg via INTRAVENOUS
  Filled 2022-09-26: qty 1

## 2022-09-26 MED ORDER — ATORVASTATIN CALCIUM 10 MG PO TABS
20.0000 mg | ORAL_TABLET | Freq: Every evening | ORAL | Status: DC
  Administered 2022-09-26 – 2022-09-27 (×2): 20 mg via ORAL
  Filled 2022-09-26 (×2): qty 2

## 2022-09-26 MED ORDER — MENTHOL 3 MG MT LOZG: 1.0000 | LOZENGE | OROMUCOSAL | Status: DC | PRN

## 2022-09-26 MED ORDER — PANTOPRAZOLE SODIUM 40 MG PO TBEC
40.0000 mg | DELAYED_RELEASE_TABLET | Freq: Every day | ORAL | Status: DC
  Administered 2022-09-26 – 2022-09-28 (×3): 40 mg via ORAL
  Filled 2022-09-26 (×3): qty 1

## 2022-09-26 MED ORDER — GABAPENTIN 100 MG PO CAPS
100.0000 mg | ORAL_CAPSULE | Freq: Once | ORAL | Status: DC
  Filled 2022-09-26: qty 1

## 2022-09-26 MED ORDER — BUPIVACAINE-MELOXICAM ER 200-6 MG/7ML IJ SOLN
INTRAMUSCULAR | Status: DC | PRN
  Administered 2022-09-26: 400 mg

## 2022-09-26 MED ORDER — POVIDONE-IODINE 10 % EX SWAB: 2.0000 | Freq: Once | CUTANEOUS | Status: DC

## 2022-09-26 MED ORDER — PROPOFOL 500 MG/50ML IV EMUL
INTRAVENOUS | Status: DC | PRN
  Administered 2022-09-26: 100 ug/kg/min via INTRAVENOUS

## 2022-09-26 MED ORDER — SODIUM CHLORIDE 0.9 % IR SOLN
Status: DC | PRN
  Administered 2022-09-26: 3000 mL

## 2022-09-26 MED ORDER — BUPIVACAINE-MELOXICAM ER 200-6 MG/7ML IJ SOLN
INTRAMUSCULAR | Status: AC
  Filled 2022-09-26: qty 2

## 2022-09-26 SURGICAL SUPPLY — 66 items
ATTUNE MED DOME PAT 32 KNEE (Knees) IMPLANT
ATTUNE PSFEM RTSZ5 NARCEM KNEE (Femur) IMPLANT
BANDAGE ESMARK 6X9 LF (GAUZE/BANDAGES/DRESSINGS) ×1 IMPLANT
BASEPLATE TIB CMT FB PCKT SZ4 (Stem) IMPLANT
BIT DRILL 3.2X128 (BIT) IMPLANT
BLADE SAGITTAL 25.0X1.27X90 (BLADE) ×1 IMPLANT
BLADE SAW SGTL 11.0X1.19X90.0M (BLADE) ×1 IMPLANT
BLADE SURG SZ10 CARB STEEL (BLADE) ×1 IMPLANT
BNDG CMPR 9X6 STRL LF SNTH (GAUZE/BANDAGES/DRESSINGS) ×1
BNDG ESMARK 6X9 LF (GAUZE/BANDAGES/DRESSINGS) ×1
BSPLAT TIB 4 CMNT FX BRNG STRL (Stem) ×1 IMPLANT
CEMENT HV SMART SET (Cement) ×2 IMPLANT
CLOTH BEACON ORANGE TIMEOUT ST (SAFETY) ×1 IMPLANT
COOLER ICEMAN CLASSIC (MISCELLANEOUS) ×1 IMPLANT
COVER LIGHT HANDLE STERIS (MISCELLANEOUS) ×2 IMPLANT
CUFF TOURN SGL QUICK 34 (TOURNIQUET CUFF) ×1
CUFF TRNQT CYL 34X4.125X (TOURNIQUET CUFF) ×1 IMPLANT
DRAPE EXTREMITY T 121X128X90 (DISPOSABLE) ×1 IMPLANT
DRESSING AQUACEL AG ADV 3.5X12 (MISCELLANEOUS) ×1 IMPLANT
DRSG AQUACEL AG ADV 3.5X10 (GAUZE/BANDAGES/DRESSINGS) IMPLANT
DRSG AQUACEL AG ADV 3.5X12 (MISCELLANEOUS) ×1
DURAPREP 26ML APPLICATOR (WOUND CARE) ×2 IMPLANT
ELECT REM PT RETURN 9FT ADLT (ELECTROSURGICAL) ×1
ELECTRODE REM PT RTRN 9FT ADLT (ELECTROSURGICAL) ×1 IMPLANT
GLOVE BIO SURGEON STRL SZ7 (GLOVE) IMPLANT
GLOVE BIOGEL PI IND STRL 7.0 (GLOVE) ×3 IMPLANT
GLOVE BIOGEL PI IND STRL 7.5 (GLOVE) IMPLANT
GLOVE BIOGEL PI IND STRL 8.5 (GLOVE) ×1 IMPLANT
GLOVE ECLIPSE 6.5 STRL STRAW (GLOVE) IMPLANT
GLOVE SKINSENSE STRL SZ8.0 LF (GLOVE) ×1 IMPLANT
GOWN STRL REUS W/ TWL LRG LVL3 (GOWN DISPOSABLE) IMPLANT
GOWN STRL REUS W/TWL LRG LVL3 (GOWN DISPOSABLE) ×4 IMPLANT
GOWN STRL REUS W/TWL XL LVL3 (GOWN DISPOSABLE) ×1 IMPLANT
HANDPIECE INTERPULSE COAX TIP (DISPOSABLE) ×1
HOOD W/PEELAWAY (MISCELLANEOUS) ×4 IMPLANT
INSERT TIB ATTUNE FB SZ5X6 (Insert) IMPLANT
INST SET MAJOR BONE (KITS) ×1 IMPLANT
IV NS IRRIG 3000ML ARTHROMATIC (IV SOLUTION) ×1 IMPLANT
KIT BLADEGUARD II DBL (SET/KITS/TRAYS/PACK) ×1 IMPLANT
KIT TURNOVER KIT A (KITS) ×1 IMPLANT
MANIFOLD NEPTUNE II (INSTRUMENTS) ×1 IMPLANT
MARKER SKIN DUAL TIP RULER LAB (MISCELLANEOUS) ×1 IMPLANT
NS IRRIG 1000ML POUR BTL (IV SOLUTION) ×1 IMPLANT
PACK BASIC III (CUSTOM PROCEDURE TRAY) ×1
PACK SRG BSC III STRL LF ECLPS (CUSTOM PROCEDURE TRAY) IMPLANT
PACK TOTAL JOINT (CUSTOM PROCEDURE TRAY) ×1 IMPLANT
PAD ARMBOARD 7.5X6 YLW CONV (MISCELLANEOUS) ×1 IMPLANT
PAD COLD SHLDR SM WRAP-ON (PAD) ×1 IMPLANT
PILLOW KNEE EXTENSION 0 DEG (MISCELLANEOUS) ×1 IMPLANT
PIN/DRILL PACK ORTHO 1/8X3.0 (PIN) ×1 IMPLANT
SAW OSC TIP CART 19.5X105X1.3 (SAW) ×1 IMPLANT
SET BASIN LINEN APH (SET/KITS/TRAYS/PACK) ×1 IMPLANT
SET HNDPC FAN SPRY TIP SCT (DISPOSABLE) ×1 IMPLANT
SOLUTION IRRIG SURGIPHOR (IV SOLUTION) IMPLANT
STAPLER VISISTAT 35W (STAPLE) ×1 IMPLANT
SUT BRALON NAB BRD #1 30IN (SUTURE) ×1 IMPLANT
SUT MNCRL 0 VIOLET CTX 36 (SUTURE) ×1 IMPLANT
SUT MON AB 0 CT1 (SUTURE) ×1 IMPLANT
SUT MONOCRYL 0 CTX 36 (SUTURE) ×1
SYR 20ML LL LF (SYRINGE) IMPLANT
SYR BULB IRRIG 60ML STRL (SYRINGE) ×1 IMPLANT
TOWEL OR 17X26 4PK STRL BLUE (TOWEL DISPOSABLE) ×1 IMPLANT
TOWER CARTRIDGE SMART MIX (DISPOSABLE) ×1 IMPLANT
TRAY FOLEY MTR SLVR 16FR STAT (SET/KITS/TRAYS/PACK) ×1 IMPLANT
WATER STERILE IRR 1000ML POUR (IV SOLUTION) ×2 IMPLANT
YANKAUER SUCT 12FT TUBE ARGYLE (SUCTIONS) ×1 IMPLANT

## 2022-09-26 NOTE — Plan of Care (Signed)
  Problem: Acute Rehab PT Goals(only PT should resolve) Goal: Pt Will Go Supine/Side To Sit Outcome: Progressing Flowsheets (Taken 09/26/2022 1513) Pt will go Supine/Side to Sit: with supervision Goal: Patient Will Transfer Sit To/From Stand Outcome: Progressing Flowsheets (Taken 09/26/2022 1513) Patient will transfer sit to/from stand: with min guard assist Goal: Pt Will Transfer Bed To Chair/Chair To Bed Outcome: Progressing Flowsheets (Taken 09/26/2022 1513) Pt will Transfer Bed to Chair/Chair to Bed: min guard assist Goal: Pt Will Ambulate Outcome: Progressing Flowsheets (Taken 09/26/2022 1513) Pt will Ambulate:  100 feet  with rolling walker  with min guard assist

## 2022-09-26 NOTE — Op Note (Signed)
Orthopaedic Surgery Operative Note (CSN: [id number])  Stacy [name]  [birth year] Date of Surgery: 09/26/2022   Dictation for total knee replacement  09/26/2022  9:57 AM  Orthopaedic Surgery Operative Note (CSN: [id number])  Stacy [name]  January 27, 1956 Date of Surgery: 09/26/2022  09/26/2022  9:56 AM  PATIENT:  Stacy [name]  67 y.o. female  PRE-OPERATIVE DIAGNOSIS:  osteoarthritis right knee  POST-OPERATIVE DIAGNOSIS:  osteoarthritis right knee  PROCEDURE:  Procedure(s): RIGHT TOTAL KNEE ARTHROPLASTY (Right)  SURGEON:  Surgeon(s) and Role:    Carole Civil, MD - Primary  PHYSICIAN ASSISTANT:   ASSISTANTS: Presley Raddle and Nicki Cox    ANESTHESIA:   spinal  EBL:  10 mL   BLOOD ADMINISTERED:none  DRAINS: none   LOCAL MEDICATIONS USED:  OTHER zinrelef  SPECIMEN:  No Specimen  DISPOSITION OF SPECIMEN:  N/A  COUNTS:  YES  TOURNIQUET:   Total Tourniquet Time Documented: Thigh (Right) - 98 minutes Total: Thigh (Right) - 98 minutes   DICTATION: .Viviann Spare Dictation  PLAN OF CARE: Admit to inpatient   PATIENT DISPOSITION:  PACU - hemodynamically stable.   Delay start of Pharmacological VTE agent (>24hrs) due to surgical blood loss or risk of bleeding: yes   Implants: Implant Name Type Inv. Item Serial No. Manufacturer Lot No. LRB No. Used Action  CEMENT HV SMART SET - [id number] Cement CEMENT HV SMART SET  [employer]PU[employer] [id number] Right 2 Implanted  BASEPLATE TIB CMT FB PCKT SZ4 - [id number] Stem BASEPLATE TIB CMT FB PCKT SZ4  [employer]PU[employer] [id number] Right 1 Implanted  ATTUNE MED DOME PAT 32 KNEE - [id number] Knees ATTUNE MED DOME PAT 32 KNEE  DEPU[employer]TH[employer] [id number] Right 1 Implanted  ATTUNE PSFEM RTSZ5 NARCEM KNEE - [id number] Femur ATTUNE PSFEM RTSZ5 NARCEM KNEE  [employer]PU[employer]TH[employer] [id number] Right 1 Implanted  INSERT TIB ATTUNE FB [id number] - [id number] Insert INSERT TIB ATTUNE FB [id number]  DEPU[employer] ORTH[employer] [id number] Right 1 Implanted      Indications for Surgery:   Stacy [name] is a 67 y.o. female who Presented for right total knee arthroplasty after failing nonoperative and conservative treatment .  Benefits and risks of operative and nonoperative management were discussed prior to surgery with patient/guardian(s) and informed consent form was completed.  While all risks cannot be anticipated, specific risks including infection, need for additional surgery, stiffness, postop pain, infection, implant removal, loosening, infection requiring amputation, deep vein thrombosis, pulmonary embolus were discussed.   Procedure:   The patient was identified properly. Informed consent was obtained and the surgical site was marked. The patient was taken to the OR where general anesthesia was induced.  The patient was positioned supine on the operating table and a Foley catheter was inserted.  .  The right knee and lower extremity was prepped and draped in the usual sterile fashion.  Timeout was performed before the beginning of the case.  Tourniquet was used for the above duration.   Details of surgery: The patient was identified by 2 approved identification mechanisms. The operative extremity was evaluated and found to be acceptable for surgical treatment today. The chart was reviewed. The surgical site was confirmed and marked. The patient had a saphenous nerve block postoperatively  The patient was taken to the operating room and given appropriate antibiotic Ancef 2 g. This is consistent with the SCIP protocol.  The patient was given the following anesthetic: Spinal  The patient was then placed supine on the operating table. A Foley catheter was inserted. The operative extremity was  prepped and draped sterilely from the toes to the groin.  Timeout was executed confirming the patient's name, surgical site, antibiotic administration, x-rays available, and implants available.  The operative limb,  was exsanguinated with a  six-inch Esmarch and the tourniquet was inflated to 300 mmHg.  A straight midline incision was made over the right KNEE and taken down to the extensor mechanism. A medial arthrotomy was performed. The patella was everted and the patellofemoral soft tissue was released, along with the patellar fat pad.  The anterior cruciate ligament and PCL were resected.  The anterior horns of the lateral and medial meniscus were resected. The medial soft tissue sleeve was elevated to the mid coronal plane.  A three-eighths inch drill bit was used to enter the femoral canal which was decompressed with suction and irrigation until clear.   The distal femoral cutting guide was set for 11 mm distal resection,  5valgus alignment, for a right knee. The distal femur was resected and checked for flatness.  The attune sizing femoral guide was placed and the femur was preliminarily sized to a size 5.   The external alignment guide for the tibial resection was then applied to the distal and proximal tibia and set for anatomic slope along with 9 MM resection  from the higher lateral side.   Rotational alignment was set using the malleolus, the tibial tubercle and the tibial spines.  The proximal tibia was resected along with  residual menisci. The tibia was sized using a base plate to a size 4.   The extension gap was checked.  It was tight I took an additional 2 mm from the distal femur and proximal tibia.  At that point I tried a 5 and 6 block in the 6 fit well.  Medial lateral stability was normal.   A 4-in-1 cutting block was placed along with collateral ligament retractors.  Our target was proper rotation based on the epicondylar axis using Whitesides line as a guide as well.  Once the block was pinned in place we took the spacer block that balanced extension gap and placed it on top of the tibia under the femoral block.  This fit snugly with the block moved down to take less posterior femur.  (once the block was  moved down an angel wing was placed to check for notching prior to the actual cut).  Once I was satisfied with the spacer block collateral ligament retractors were placed and I completed the 4 distal femoral cuts   The extension gap was rechecked with the same spacer block which was a size 6   The correct sized notch cutting guide for the femur was then applied and the notch cut was made.  Trial reduction was completed using size 5 femur, 4 tibia, 6 block trial implants. Patella tracking was normal  We then skeletonized the patella. It measured 20 mm in thickness and the patellar resection was set for 12 millimeters. the patellar resection was completed. The patella diameter measured 35 however based on the residual amount of patella of 10 mm I went for the thinnest patellar button which was a 32  The proximal tibia was prepared using the size 4 base plate.  Thorough irrigation was performed using saline  and the bone was dried and prepared for cement. The cement was mixed on the back table using third generation preparation techniques  The implants were then cemented in place and excess cement was removed. The cement was allowed to cure. Surgipor  irrigation was placed followed by saline irrigation  Any excess bone fragments and cement was removed.  The extensor mechanism was closed with #1 Bralon suture followed by subcutaneous tissue closure using 0 Monocryl suture.  Zynrelef a total of 2 vials were injected prior to complete extensor mechanism closure.  The openings and the capsule were then closed with #1 Braylon   Skin approximation was performed using staples  A sterile dressing was applied, TED hose were placed on the operative extremity followed by Cryo/Cuff.  The patient was taken recovery room in stable condition  Postop plan: Weightbearing as tolerated CPM machine Immediate physical therapy Discharge tomorrow if stable

## 2022-09-26 NOTE — TOC Initial Note (Signed)
Transition of Care [facility]) - Initial/Assessment Note    Patient Details  Name: Stacy [name] MRN: [id number] Date of Birth: Jul 07, 1955  Transition of Care [facility]) CM/SW Contact:    Boneta Lucks, RN Phone Number: 09/26/2022, 11:17 AM  Clinical Narrative:       Patient is on OR for knee right replacement. She has been accepted for [facility] home health in [place], [place] updated to order. Judson Roch is following for discharge orders.             Expected Discharge Plan: [place] Barriers to Discharge: Continued Medical Work up   Patient Goals and CMS Choice Patient states their goals for this hospitalization and ongoing recovery are:: to return home.   Expected Discharge Plan and [place] with [facility]   Prior Living Arrangements/Services       Admission diagnosis:  Osteoarthritis of right knee [M17.11] Patient Active Problem List   Diagnosis Date Noted   Osteoarthritis of right knee 09/26/2022   Morbid obesity 08/16/2021   Hyperlipidemia 08/16/2021   Edema of both lower extremities 08/16/2021   At risk for falls 08/16/2021   Anemia, unspecified 03/23/2020   Impaired fasting glucose 03/23/2020   Arthralgia of lower leg 11/04/2019   Morbid obesity with BMI of 45.0-49.9, adult 03/13/2018   AKI (acute kidney injury) 03/09/2018   Bile leak, postoperative 03/09/2018   Essential hypertension 03/09/2018   S/P laparoscopic cholecystectomy 03/09/2018   PCP:  Sherrilee Gilles, DO Pharmacy:   [place], [place] [id number] [place] [id number] Phone: [phone] Fax: [phone]   Social Determinants of Health (SDOH) Social History: SDOH Screenings   Tobacco Use: Low Risk  (09/26/2022)   SDOH Interventions:   Readmission Risk Interventions     No data to display

## 2022-09-26 NOTE — Anesthesia Procedure Notes (Cosign Needed)
Spinal  Patient location during procedure: OR Start time: 09/26/2022 7:35 AM End time: 09/26/2022 7:41 AM Reason for block: surgical anesthesia Staffing Performed: resident/CRNA and other anesthesia staff  Resident/CRNA: Myna Bright, CRNA Performed by: Louann Sjogren, MD Authorized by: Louann Sjogren, MD   Preanesthetic Checklist Completed: patient identified, IV checked, risks and benefits discussed, surgical consent, monitors and equipment checked, pre-op evaluation and timeout performed Spinal Block Patient position: sitting Prep: DuraPrep and site prepped and draped Patient monitoring: heart rate, cardiac monitor, continuous pulse ox and blood pressure Approach: midline Location: L4-5 Injection technique: single-shot Needle Needle type: Pencan  Needle gauge: 24 G Needle length: 10 cm Needle insertion depth: 7 cm Assessment Sensory level: T12 Events: CSF return Additional Notes Placed per Mammie Russian, SRNA

## 2022-09-26 NOTE — Evaluation (Addendum)
Physical Therapy Evaluation Patient Details Name: Stacy [name] MRN: [id number] DOB: January 31, 1956 Today's Date: 09/26/2022  History of Present Illness  67 year old female with hypertension presents with bilateral knee pain right greater than left she has severe deformity and arthritis both knees right worse than left.  She has failed nonoperative treatment which included weight loss she HAS  gone under BMI of 40, activity modification, injection, oral NSAID therapy     She is interested in proceeding with right total knee arthroplasty     We discussed the risk and benefits of surgery which include but were not limited to infection, blood clot, pulmonary embolism, postop stiffness and arthrofibrosis.    Clinical Impression  Patient asleep on therapist arrival but rouses easily.  PT assists patient with removing ice and foam extension wedge.  PT guides patient in supine ankle pumps and quad sets; poor to fair right quad set.  Patient performs supine to sit modified independently taking extra time to perform due to right leg pain.  Once sitting she demonstrates good sitting posture; slight forward flexed trunk.  PT assists patient with sit to stand to RW with min A and she ambulates in the room x 30 ft with occasional min A for steering walker.  Patient demonstrates antalgic gait; decreased stance Right lower extremity and forward flexed trunk.  PT assists patient back to bed and she is left with call button in reach; ice put back on her knee and right foot back into blue foam extension wedge.  Daughter at bedside.  Nursing notified of above.  Patient will benefit from continued skilled therapy services during the remainder of her hospital stay and at the next recommended venue of care to address deficits and promote return to optimal function.         Recommendations for follow up therapy are one component of a multi-disciplinary discharge planning process, led by the attending physician.  Recommendations  may be updated based on patient status, additional functional criteria and insurance authorization.  Follow Up Recommendations       Assistance Recommended at Discharge Intermittent Supervision/Assistance  Patient can return home with the following  A little help with walking and/or transfers;A little help with bathing/dressing/bathroom;Help with stairs or ramp for entrance    Equipment Recommendations Rolling walker (2 wheels);BSC/3in1  Recommendations for Other Services       Functional Status Assessment Patient has had a recent decline in their functional status and demonstrates the ability to make significant improvements in function in a reasonable and predictable amount of time.     Precautions / Restrictions Precautions Precautions: Fall Restrictions Weight Bearing Restrictions: No      Mobility  Bed Mobility Overal bed mobility: Modified Independent             General bed mobility comments: supine to sit takes extra time secondary to pain Patient Response: Cooperative  Transfers Overall transfer level: Needs assistance   Transfers: Sit to/from Stand Sit to Stand: Min assist           General transfer comment: sit to stand to RW with min Assist; needs 2 attempts    Ambulation/Gait Ambulation/Gait assistance: Min assist Gait Distance (Feet): 30 Feet Assistive device: Rolling walker (2 wheels) Gait Pattern/deviations: Decreased stance time - right, Antalgic, Wide base of support, Trunk flexed       General Gait Details: antalgic gait; slow gait speed  Stairs            Wheelchair Mobility  Modified Rankin (Stroke Patients Only)       Balance Overall balance assessment: Needs assistance Sitting-balance support: Bilateral upper extremity supported, Feet supported Sitting balance-Leahy Scale: Good     Standing balance support: Bilateral upper extremity supported, During functional activity, Reliant on assistive device for  balance Standing balance-Leahy Scale: Fair Standing balance comment: fair standing balance with RW; forward flexed trunk; CGA to min A for navigating walker                             Pertinent Vitals/Pain Pain Assessment Pain Assessment: 0-10 Pain Score: 3  Pain Intervention(s): Limited activity within patient's tolerance, Ice applied    Home Living Family/patient expects to be discharged to:: Private residence Living Arrangements: Spouse/significant other Available Help at Discharge: Family Type of Home: House Home Access: Stairs to enter Entrance Stairs-Rails: Left;Right;Can reach both Technical brewer of Steps: 4   Home Layout: One level Home Equipment: Rollator (4 wheels)      Prior Function Prior Level of Function : Independent/Modified Independent                     Hand Dominance   Dominant Hand: Right    Extremity/Trunk Assessment   Upper Extremity Assessment Upper Extremity Assessment: Overall WFL for tasks assessed    Lower Extremity Assessment Lower Extremity Assessment: RLE deficits/detail RLE Deficits / Details: s/p Right TKA    Cervical / Trunk Assessment Cervical / Trunk Assessment: Normal  Communication   Communication: No difficulties  Cognition Arousal/Alertness: Awake/alert Behavior During Therapy: WFL for tasks assessed/performed Overall Cognitive Status: Within Functional Limits for tasks assessed                                          General Comments      Exercises Total Joint Exercises Ankle Circles/Pumps: AROM, 10 reps, Supine Quad Sets: AROM, 5 reps, Supine   Assessment/Plan    PT Assessment Patient needs continued PT services  PT Problem List Decreased strength;Decreased range of motion;Decreased activity tolerance;Pain;Decreased mobility;Decreased balance       PT Treatment Interventions DME instruction;Balance training;Gait training;Stair training;Functional mobility  training;Patient/family education;Therapeutic activities;Therapeutic exercise    PT Goals (Current goals can be found in the Care Plan section)  Acute Rehab PT Goals Patient Stated Goal: return home PT Goal Formulation: With patient/family Time For Goal Achievement: 10/10/22 Potential to Achieve Goals: Good    Frequency BID     Co-evaluation               AM-PAC PT "6 Clicks" Mobility  Outcome Measure Help needed turning from your back to your side while in a flat bed without using bedrails?: None Help needed moving from lying on your back to sitting on the side of a flat bed without using bedrails?: A Little Help needed moving to and from a bed to a chair (including a wheelchair)?: A Little Help needed standing up from a chair using your arms (e.g., wheelchair or bedside chair)?: A Little Help needed to walk in hospital room?: A Little Help needed climbing 3-5 steps with a railing? : A Lot 6 Click Score: 18    End of Session   Activity Tolerance: Patient tolerated treatment well Patient left: in bed;with family/visitor present;with call bell/phone within reach Nurse Communication: Mobility status PT Visit Diagnosis: Other abnormalities  of gait and mobility (R26.89);Difficulty in walking, not elsewhere classified (R26.2);Pain Pain - Right/Left: Right Pain - part of body: Knee    Time: 1430-1454 PT Time Calculation (min) (ACUTE ONLY): 24 min   Charges:   PT Evaluation $PT Eval Low Complexity: 1 Low          3:15 PM, 09/26/22 Kathy Wahid Small Girtie Wiersma MPT [place] [facility] [place] [phone]

## 2022-09-26 NOTE — Anesthesia Procedure Notes (Signed)
Anesthesia Regional Block: Adductor canal block   Pre-Anesthetic Checklist: , timeout performed,  Correct Patient, Correct Site, Correct Laterality,  Correct Procedure, Correct Position, risks and benefits discussed,  Pre-op evaluation,  At surgeon's request and post-op pain management  Laterality: Right  Prep: chloraprep       Needles:  Injection technique: Single-shot  Needle Type: Echogenic Stimulator Needle     Needle Length: 9cm  Needle Gauge: 22   Needle insertion depth: 6 cm   Additional Needles:   Procedures:,,,, ultrasound used (permanent image in chart),,    Narrative:  Start time: 09/26/2022 10:10 AM End time: 09/26/2022 10:19 AM Injection made incrementally with aspirations every 5 mL.  Performed by: Other  Anesthesiologist: Louann Sjogren, MD  Additional Notes: Other: performed by Mammie Russian, SRNA under supervision of MD [facility]

## 2022-09-26 NOTE — Anesthesia Preprocedure Evaluation (Signed)
Anesthesia Evaluation  Patient identified by MRN, date of birth, ID band Patient awake    Reviewed: Allergy & Precautions, H&P , NPO status , Patient's Chart, lab work & pertinent test results, reviewed documented beta blocker date and time   Airway Mallampati: II  TM Distance: >3 FB Neck ROM: full    Dental no notable dental hx.    Pulmonary neg pulmonary ROS   Pulmonary exam normal breath sounds clear to auscultation       Cardiovascular Exercise Tolerance: Good hypertension, negative cardio ROS  Rhythm:regular Rate:Normal     Neuro/Psych negative neurological ROS  negative psych ROS   GI/Hepatic negative GI ROS, Neg liver ROS,,,  Endo/Other    Morbid obesity  Renal/GU Renal diseasenegative Renal ROS  negative genitourinary   Musculoskeletal   Abdominal   Peds  Hematology negative hematology ROS (+) Blood dyscrasia, anemia   Anesthesia Other Findings   Reproductive/Obstetrics negative OB ROS                             Anesthesia Physical Anesthesia Plan  ASA: 3  Anesthesia Plan: Spinal   Post-op Pain Management: Regional block*   Induction:   PONV Risk Score and Plan:   Airway Management Planned:   Additional Equipment:   Intra-op Plan:   Post-operative Plan:   Informed Consent: I have reviewed the patients History and Physical, chart, labs and discussed the procedure including the risks, benefits and alternatives for the proposed anesthesia with the patient or authorized representative who has indicated his/her understanding and acceptance.     Dental Advisory Given  Plan Discussed with: CRNA  Anesthesia Plan Comments:        Anesthesia Quick Evaluation

## 2022-09-26 NOTE — Brief Op Note (Signed)
09/26/2022  9:56 AM  PATIENT:  Stacy [name]  67 y.o. female  PRE-OPERATIVE DIAGNOSIS:  osteoarthritis right knee  POST-OPERATIVE DIAGNOSIS:  osteoarthritis right knee  PROCEDURE:  Procedure(s): RIGHT TOTAL KNEE ARTHROPLASTY (Right)  SURGEON:  Surgeon(s) and Role:    Carole Civil, MD - Primary  PHYSICIAN ASSISTANT:   ASSISTANTS: Presley Raddle and Nicki Cox    ANESTHESIA:   spinal  EBL:  10 mL   BLOOD ADMINISTERED:none  DRAINS: none   LOCAL MEDICATIONS USED:  OTHER zinrelef  SPECIMEN:  No Specimen  DISPOSITION OF SPECIMEN:  N/A  COUNTS:  YES  TOURNIQUET:   Total Tourniquet Time Documented: Thigh (Right) - 98 minutes Total: Thigh (Right) - 98 minutes   DICTATION: .Viviann Spare Dictation  PLAN OF CARE: Admit to inpatient   PATIENT DISPOSITION:  PACU - hemodynamically stable.   Delay start of Pharmacological VTE agent (>24hrs) due to surgical blood loss or risk of bleeding: yes

## 2022-09-26 NOTE — Interval H&P Note (Signed)
History and Physical Interval Note:  09/26/2022 7:14 AM  Stacy [name]  has presented today for surgery, with the diagnosis of osteoarthritis right knee.  The various methods of treatment have been discussed with the patient and family. After consideration of risks, benefits and other options for treatment, the patient has consented to  Procedure(s): TOTAL KNEE ARTHROPLASTY (Right) as a surgical intervention.  The patient's history has been reviewed, patient examined, no change in status, stable for surgery.  I have reviewed the patient's chart and labs.  Questions were answered to the patient's satisfaction.     Arther Abbott

## 2022-09-26 NOTE — Transfer of Care (Signed)
Immediate Anesthesia Transfer of Care Note  Patient: Ketara Maestre  Procedure(s) Performed: RIGHT TOTAL KNEE ARTHROPLASTY (Right: Knee)  Patient Location: PACU  Anesthesia Type:Spinal and MAC combined with regional for post-op pain  Level of Consciousness: awake, alert , oriented, and patient cooperative  Airway & Oxygen Therapy: Patient Spontanous Breathing and Patient connected to nasal cannula oxygen  Post-op Assessment: Report given to RN, Post -op Vital signs reviewed and stable, and Patient moving all extremities  Post vital signs: Reviewed and stable  Last Vitals:  Vitals Value Taken Time  BP 122/69 09/26/22 1000  Temp    Pulse 75 09/26/22 1003  Resp 22 09/26/22 1003  SpO2 97 % 09/26/22 1003  Vitals shown include unvalidated device data.  Last Pain:  Vitals:   09/26/22 0638  TempSrc: Oral  PainSc: 0-No pain      Patients Stated Pain Goal: 6 ([id number])  Complications: No notable events documented.

## 2022-09-27 DIAGNOSIS — M1711 Unilateral primary osteoarthritis, right knee: Secondary | ICD-10-CM | POA: Diagnosis not present

## 2022-09-27 LAB — BASIC METABOLIC PANEL
Anion gap: 10 (ref 5–15)
BUN: 34 mg/dL — ABNORMAL HIGH (ref 8–23)
CO2: 19 mmol/L — ABNORMAL LOW (ref 22–32)
Calcium: 8.1 mg/dL — ABNORMAL LOW (ref 8.9–10.3)
Chloride: 109 mmol/L (ref 98–111)
Creatinine, Ser: 0.93 mg/dL (ref 0.44–1.00)
GFR, Estimated: >60 mL/min (ref >60)
Glucose, Bld: 69 mg/dL — ABNORMAL LOW (ref 70–99)
Potassium: 3.8 mmol/L (ref 3.5–5.1)
Sodium: 138 mmol/L (ref 135–145)

## 2022-09-27 LAB — CBC
HCT: 40.9 % (ref 36.0–46.0)
Hemoglobin: 11.9 g/dL — ABNORMAL LOW (ref 12.0–15.0)
MCH: 26.5 pg (ref 26.0–34.0)
MCHC: 29.1 g/dL — ABNORMAL LOW (ref 30.0–36.0)
MCV: 91.1 fL (ref 80.0–100.0)
Platelets: 220 10*3/uL (ref 150–400)
RBC: 4.49 MIL/uL (ref 3.87–5.11)
RDW: 19.1 % — ABNORMAL HIGH (ref 11.5–15.5)
WBC: 14.4 10*3/uL — ABNORMAL HIGH (ref 4.0–10.5)
nRBC: 0 % (ref 0.0–0.2)

## 2022-09-27 MED ORDER — LORATADINE 10 MG PO TABS
10.0000 mg | ORAL_TABLET | Freq: Every day | ORAL | Status: DC
  Administered 2022-09-27 – 2022-09-28 (×2): 10 mg via ORAL
  Filled 2022-09-27 (×2): qty 1

## 2022-09-27 NOTE — Progress Notes (Signed)
Patient a/ox4. Bone foam and knee high ted hose have remained on throughout shift. Patient has purposeful movement in RLE. Strong dorsiflexion and plantar flexion. Drainage present on dressing. Drainage marked. Patient has c/o burning/stinging pain in that R knee once, however; resolved after getting scheduled tramadol.   Patient has c/o sinus pressure/allergies twice. Patient refused any medication for this both times. Will continue to monitor.

## 2022-09-27 NOTE — Progress Notes (Signed)
Physical Therapy Treatment Patient Details Name: Stacy [name] MRN: [id number] DOB: [birth year] Today's Date: 09/27/2022   History of Present Illness 67 year old female with hypertension presents with bilateral knee pain right greater than left she has severe deformity and arthritis both knees right worse than left.  She has failed nonoperative treatment which included weight loss she HAS  gone under BMI of 40, activity modification, injection, oral NSAID therapy     She is interested in proceeding with right total knee arthroplasty     We discussed the risk and benefits of surgery which include but were not limited to infection, blood clot, pulmonary embolism, postop stiffness and arthrofibrosis.    PT Comments    Patient presents supine in bed. Patient denies pain at rest, but remains pain limited with activity. Patient transfers from bed to [place]. Transported to stairs. Educated patient on proper sequencing ascending and descending stairs. Patient unable to initiate stair ambulation due to increased knee pain in RLE support when trying to step with LLE. Performed marching in place at base of stairwell but patient demos limited hip flexion mobility for clearance of steps. Patient reporting increased pain and needing rest break. Returned to room and performed gait into hallway. Patient able to walk slightly farther distance this PM, but requires much encouragement. Patient noting increased knee pain end of session. Returned to room, placed in bed and repositioning with assist. Patient left in bed with phone and call bell in reach, nursing present in room. Patient will benefit from continued physical therapy in hospital and recommended venue below to increase strength, balance, endurance for safe ADLs and gait.     Recommendations for follow up therapy are one component of a multi-disciplinary discharge planning process, led by the attending physician.  Recommendations may be updated based on patient  status, additional functional criteria and insurance authorization.  Follow Up Recommendations       Assistance Recommended at Discharge Intermittent Supervision/Assistance  Patient can return home with the following A little help with walking and/or transfers;A little help with bathing/dressing/bathroom;Help with stairs or ramp for entrance   Equipment Recommendations  Rolling walker (2 wheels);BSC/3in1    Recommendations for Other Services       Precautions / Restrictions Precautions Precautions: Fall Restrictions Weight Bearing Restrictions: No RLE Weight Bearing: Weight bearing as tolerated     Mobility  Bed Mobility Overal bed mobility: Needs Assistance Bed Mobility: Supine to Sit, Sit to Supine     Supine to sit: Min guard Sit to supine: Min guard   General bed mobility comments: Assist for RLE placement    Transfers Overall transfer level: Needs assistance   Transfers: Sit to/from Stand Sit to Stand: Min assist           General transfer comment: sit to stand to RW    Ambulation/Gait Ambulation/Gait assistance: Min guard, Supervision Gait Distance (Feet): 80 Feet Assistive device: Rolling walker (2 wheels) Gait Pattern/deviations: Decreased stance time - right, Antalgic, Wide base of support, Trunk flexed       General Gait Details: antalgic gait; slow gait speed   Stairs Stairs: Yes       General stair comments: attempted but unable due to pain   Wheelchair Mobility    Modified Rankin (Stroke Patients Only)       Balance Overall balance assessment: Needs assistance Sitting-balance support: Bilateral upper extremity supported, Feet supported Sitting balance-Leahy Scale: Good Sitting balance - Comments: seated EOB   Standing balance support: Bilateral upper  extremity supported, During functional activity, Reliant on assistive device for balance Standing balance-Leahy Scale: Fair Standing balance comment: standing balance with  RW                            Cognition Arousal/Alertness: Awake/alert Behavior During Therapy: WFL for tasks assessed/performed Overall Cognitive Status: Within Functional Limits for tasks assessed                                          Exercises Total Joint Exercises Ankle Circles/Pumps: AROM, Seated, Both, 10 reps Quad Sets: AROM, 5 reps, Supine, Right Heel Slides: Right, AAROM, 5 reps, Supine Long Arc Quad: Right, Strengthening, 10 reps, Seated, AAROM Knee Flexion: AAROM, Seated, 10 reps Goniometric ROM: 0-65 deg Marching in Standing: Both, Strengthening, 5 reps, Standing, Seated    General Comments        Pertinent Vitals/Pain Pain Assessment Pain Assessment: No/denies pain Pain Score: 7  Pain Location: RT knee Pain Descriptors / Indicators: Aching, Throbbing, Sore Pain Intervention(s): Limited activity within patient's tolerance, Monitored during session, Ice applied    Home Living                          Prior Function            PT Goals (current goals can now be found in the care plan section) Acute Rehab PT Goals Patient Stated Goal: return home PT Goal Formulation: With patient/family Time For Goal Achievement: 10/10/22 Potential to Achieve Goals: Good Progress towards PT goals: Progressing toward goals    Frequency    BID      PT Plan Current plan remains appropriate    Co-evaluation              AM-PAC PT "6 Clicks" Mobility   Outcome Measure  Help needed turning from your back to your side while in a flat bed without using bedrails?: None Help needed moving from lying on your back to sitting on the side of a flat bed without using bedrails?: A Little Help needed moving to and from a bed to a chair (including a wheelchair)?: A Little Help needed standing up from a chair using your arms (e.g., wheelchair or bedside chair)?: A Little Help needed to walk in hospital room?: A Little Help needed  climbing 3-5 steps with a railing? : A Lot 6 Click Score: 18    End of Session Equipment Utilized During Treatment: Gait belt Activity Tolerance: Patient limited by pain Patient left: in bed;with call bell/phone within reach;with bed alarm set;with nursing/sitter in room Nurse Communication: Mobility status PT Visit Diagnosis: Other abnormalities of gait and mobility (R26.89);Difficulty in walking, not elsewhere classified (R26.2);Pain Pain - Right/Left: Right Pain - part of body: Knee     Time: [id number] PT Time Calculation (min) (ACUTE ONLY): 28 min  Charges:  $Gait Training: 8-22 mins $Therapeutic Exercise: 8-22 mins                    5:18 PM, 09/27/22 Josue Hector PT DPT  Physical Therapist with [facility]  [phone]

## 2022-09-27 NOTE — Care Management Obs Status (Signed)
[place] NOTIFICATION   Patient Details  Name: Stacy [name] MRN: [id number] Date of Birth: 06-07-1956   Medicare Observation Status Notification Given:  Yes    Tommy Medal 09/27/2022, 3:19 PM

## 2022-09-27 NOTE — Progress Notes (Signed)
Per MD Aline Brochure remove foley. Foley removed. Patient tolerated well. Patient due to void.

## 2022-09-27 NOTE — Progress Notes (Signed)
  Patient has been out of bed and ambulated several times today. Complaining of pain and soreness but otherwise ok. Patient is now back in bed, pain under control, thigh TED replaced, Ice man on and bone foam in place.

## 2022-09-27 NOTE — Progress Notes (Signed)
Physical Therapy Treatment Patient Details Name: Stacy [name] MRN: [id number] DOB: [birth year] Today's Date: 09/27/2022   History of Present Illness 67 year old female with hypertension presents with bilateral knee pain right greater than left she has severe deformity and arthritis both knees right worse than left.  She has failed nonoperative treatment which included weight loss she HAS  gone under BMI of 40, activity modification, injection, oral NSAID therapy     She is interested in proceeding with right total knee arthroplasty     We discussed the risk and benefits of surgery which include but were not limited to infection, blood clot, pulmonary embolism, postop stiffness and arthrofibrosis.    PT Comments    Patient supine in bed with ice compress in place, daughter at bedside. Patient reporting elevated pain levels, monitored during session. Completed supine LE AROM and strengthening, then progressed to seated. Patient requiring min guard assist for RLE during bed mobility. Patient completed seated RLE AROM and strengthening with assist for improved AROM. Patient able to stand with Min A using RW and increased distance ambulated in hallway (60 feet with RW). Patient noting increased fatigue and RT knee pain. Returned to room and placed in bed with min guard assist, and reapplied ice pack. Patient left in bed with HOB elevated with phone and call bell in reach, daughter present in room. Patient will benefit from continued physical therapy in hospital and recommended venue below to increase strength, balance, endurance for safe ADLs and gait.      Recommendations for follow up therapy are one component of a multi-disciplinary discharge planning process, led by the attending physician.  Recommendations may be updated based on patient status, additional functional criteria and insurance authorization.  Follow Up Recommendations       Assistance Recommended at Discharge Intermittent  Supervision/Assistance  Patient can return home with the following A little help with walking and/or transfers;A little help with bathing/dressing/bathroom;Help with stairs or ramp for entrance   Equipment Recommendations  Rolling walker (2 wheels);BSC/3in1    Recommendations for Other Services       Precautions / Restrictions Precautions Precautions: Fall Restrictions Weight Bearing Restrictions: No RLE Weight Bearing: Weight bearing as tolerated     Mobility  Bed Mobility Overal bed mobility: Needs Assistance Bed Mobility: Supine to Sit, Sit to Supine     Supine to sit: Min guard Sit to supine: Min guard   General bed mobility comments: Assist for RLE placement    Transfers Overall transfer level: Needs assistance   Transfers: Sit to/from Stand Sit to Stand: Min assist           General transfer comment: sit to stand to RW    Ambulation/Gait Ambulation/Gait assistance: Min guard, Supervision Gait Distance (Feet): 60 Feet Assistive device: Rolling walker (2 wheels) Gait Pattern/deviations: Decreased stance time - right, Antalgic, Wide base of support, Trunk flexed       General Gait Details: antalgic gait; slow gait speed   Stairs             Wheelchair Mobility    Modified Rankin (Stroke Patients Only)       Balance Overall balance assessment: Needs assistance Sitting-balance support: Bilateral upper extremity supported, Feet supported Sitting balance-Leahy Scale: Good Sitting balance - Comments: seated EOB   Standing balance support: Bilateral upper extremity supported, During functional activity, Reliant on assistive device for balance Standing balance-Leahy Scale: Fair Standing balance comment: standing balance with RW  Cognition Arousal/Alertness: Awake/alert Behavior During Therapy: WFL for tasks assessed/performed Overall Cognitive Status: Within Functional Limits for tasks assessed                                           Exercises Total Joint Exercises Ankle Circles/Pumps: AROM, 10 reps, Supine, Right Quad Sets: AROM, 5 reps, Supine, Right Heel Slides: Supine, Right, AAROM, 10 reps Long Arc Quad: Right, Strengthening, 10 reps, Seated Knee Flexion: AAROM, 5 reps, Seated Goniometric ROM: 0-65 deg    General Comments        Pertinent Vitals/Pain Pain Assessment Pain Score: 7  Pain Location: RT knee Pain Descriptors / Indicators: Aching, Throbbing, Sore Pain Intervention(s): Limited activity within patient's tolerance, Monitored during session, Ice applied    Home Living                          Prior Function            PT Goals (current goals can now be found in the care plan section) Acute Rehab PT Goals Patient Stated Goal: return home PT Goal Formulation: With patient/family Time For Goal Achievement: 10/10/22 Potential to Achieve Goals: Good Progress towards PT goals: Progressing toward goals    Frequency    BID      PT Plan Current plan remains appropriate    Co-evaluation              AM-PAC PT "6 Clicks" Mobility   Outcome Measure  Help needed turning from your back to your side while in a flat bed without using bedrails?: None Help needed moving from lying on your back to sitting on the side of a flat bed without using bedrails?: A Little Help needed moving to and from a bed to a chair (including a wheelchair)?: A Little Help needed standing up from a chair using your arms (e.g., wheelchair or bedside chair)?: A Little Help needed to walk in hospital room?: A Little Help needed climbing 3-5 steps with a railing? : A Lot 6 Click Score: 18    End of Session Equipment Utilized During Treatment: Gait belt Activity Tolerance: Patient limited by pain Patient left: in bed;with family/visitor present;with call bell/phone within reach Nurse Communication: Mobility status PT Visit Diagnosis: Other  abnormalities of gait and mobility (R26.89);Difficulty in walking, not elsewhere classified (R26.2);Pain Pain - part of body: Knee     Time: [id number] PT Time Calculation (min) (ACUTE ONLY): 31 min  Charges:  $Gait Training: 8-22 mins $Therapeutic Exercise: 8-22 mins                     11:46 AM, 09/27/22 Josue Hector PT DPT  Physical Therapist with [facility]  [phone]

## 2022-09-27 NOTE — Progress Notes (Signed)
Patient ID: Stacy [name], female   DOB: Nov 10, 1955, 67 y.o.   MRN: [id number]   POD 1  TKA   BP 129/69 (BP Location: Right Arm)   Pulse 78   Temp (!) 97.5 F (36.4 C) (Oral)   Resp 17   Ht 5\' 4"  (1.626 m)   Wt 104.3 kg   SpO2 100%   BMI 39.47 kg/m      Latest Ref Rng & Units 09/27/2022    4:08 AM 09/18/2022    3:05 PM  CBC  WBC 4.0 - 10.5 K/uL 14.4  8.9   Hemoglobin 12.0 - 15.0 g/dL 11.9  13.3   Hematocrit 36.0 - 46.0 % 40.9  44.2   Platelets 150 - 400 K/uL 220  197    No issues with [name] today other than pain control and inability to perform on the steps  She will stay another day to work on that and then should be able to discharge on Thursday

## 2022-09-28 ENCOUNTER — Telehealth: Payer: Self-pay | Admitting: Orthopedic Surgery

## 2022-09-28 DIAGNOSIS — M1711 Unilateral primary osteoarthritis, right knee: Secondary | ICD-10-CM

## 2022-09-28 LAB — CBC
HCT: 37.4 % (ref 36.0–46.0)
Hemoglobin: 11.6 g/dL — ABNORMAL LOW (ref 12.0–15.0)
MCH: 26.8 pg (ref 26.0–34.0)
MCHC: 31 g/dL (ref 30.0–36.0)
MCV: 86.4 fL (ref 80.0–100.0)
Platelets: 250 10*3/uL (ref 150–400)
RBC: 4.33 MIL/uL (ref 3.87–5.11)
RDW: 18.6 % — ABNORMAL HIGH (ref 11.5–15.5)
WBC: 15.1 10*3/uL — ABNORMAL HIGH (ref 4.0–10.5)
nRBC: 0 % (ref 0.0–0.2)

## 2022-09-28 MED ORDER — ASPIRIN 81 MG PO CHEW: 81.0000 mg | CHEWABLE_TABLET | Freq: Two times a day (BID) | ORAL | 0 refills | Status: DC

## 2022-09-28 MED ORDER — HYDROCODONE-ACETAMINOPHEN 10-325 MG PO TABS: 1.0000 | ORAL_TABLET | ORAL | 0 refills | Status: AC | PRN

## 2022-09-28 MED ORDER — TRAMADOL HCL 50 MG PO TABS: 50.0000 mg | ORAL_TABLET | Freq: Four times a day (QID) | ORAL | 0 refills | Status: DC

## 2022-09-28 MED ORDER — METHOCARBAMOL 500 MG PO TABS: 500.0000 mg | ORAL_TABLET | Freq: Four times a day (QID) | ORAL | 2 refills | Status: DC | PRN

## 2022-09-28 MED ORDER — POLYETHYLENE GLYCOL 3350 17 G PO PACK: 17.0000 g | PACK | Freq: Every day | ORAL | 0 refills | Status: DC | PRN

## 2022-09-28 NOTE — Telephone Encounter (Signed)
Dr. Amedeo Kinsman pt - spoke w/the patient, she is requesting a refill on Oxycodone 5-325, 2 every 6 hours to be sent to [facility] in [place].  She stated it was initially called in by the ED doctor.

## 2022-09-28 NOTE — Telephone Encounter (Signed)
She is still in hospital, hopefully d/c today I called Alinda Sierras to advise.

## 2022-09-28 NOTE — Discharge Summary (Signed)
Physician Discharge Summary  Patient ID: Stacy [name] MRN: [id number] DOB/AGE: Oct 17, 1955 67 y.o.  Admit date: 09/26/2022 Discharge date: 09/28/2022  Admission Diagnoses: Osteoarthritis right knee  Discharge Diagnoses:  Principal Problem:   Osteoarthritis of right knee   Discharged Condition: good  Hospital Course:  Hospital day 1 surgery right total knee arthroplasty attune fixed-bearing posterior stabilized implant Postop saphenous nerve block   Consults: None  Significant Diagnostic Studies: labs:     Latest Ref Rng & Units 09/28/2022    5:16 AM 09/27/2022    4:08 AM 09/18/2022    3:05 PM  CBC  WBC 4.0 - 10.5 K/uL 15.1  14.4  8.9   Hemoglobin 12.0 - 15.0 g/dL 11.6  11.9  13.3   Hematocrit 36.0 - 46.0 % 37.4  40.9  44.2   Platelets 150 - 400 K/uL 250  220  197       Latest Ref Rng & Units 09/27/2022    4:08 AM 09/18/2022    3:05 PM  BMP  Glucose 70 - 99 mg/dL 69  87   BUN 8 - 23 mg/dL 34  38   Creatinine 0.44 - 1.00 mg/dL 0.93  1.56   Sodium 135 - 145 mmol/L 138  140   Potassium 3.5 - 5.1 mmol/L 3.8  3.8   Chloride 98 - 111 mmol/L 109  109   CO2 22 - 32 mmol/L 19  22   Calcium 8.9 - 10.3 mg/dL 8.1  8.9     Discharge Exam: Blood pressure 102/64, pulse 93, temperature 99.3 F (37.4 C), temperature source Oral, resp. rate 19, height 5\' 4"  (1.626 m), weight 104.3 kg, SpO2 97 %. Physical Exam Vitals and nursing note reviewed.  Constitutional:      Appearance: Normal appearance.  HENT:     Head: Normocephalic and atraumatic.  Eyes:     General: No scleral icterus.       Right eye: No discharge.        Left eye: No discharge.     Extraocular Movements: Extraocular movements intact.     Conjunctiva/sclera: Conjunctivae normal.     Pupils: Pupils are equal, round, and reactive to light.  Cardiovascular:     Rate and Rhythm: Normal rate.     Pulses: Normal pulses.  Skin:    General: Skin is warm and dry.     Capillary Refill: Capillary refill takes less than 2  seconds.  Neurological:     General: No focal deficit present.     Mental Status: She is alert and oriented to person, place, and time.  Psychiatric:        Mood and Affect: Mood normal.        Behavior: Behavior normal.        Thought Content: Thought content normal.        Judgment: Judgment normal.    Right knee and leg alignment looks great no signs of infection dressing has a scant amount of sanguinous drainage.  No signs of deep vein thrombosis  Disposition: Discharge disposition: 01-Home or Self Care       Discharge Instructions     Call MD / Call 911   Complete by: As directed    If you experience chest pain or shortness of breath, CALL 911 and be transported to the hospital emergency room.  If you develope a fever above 101 F, pus (white drainage) or increased drainage or redness at the wound, or calf pain, call your surgeon's office.  Constipation Prevention   Complete by: As directed    Drink plenty of fluids.  Prune juice may be helpful.  You may use a stool softener, such as Colace (over the counter) 100 mg twice a day.  Use MiraLax (over the counter) for constipation as needed.   Diet - low sodium heart healthy   Complete by: As directed    Discharge instructions   Complete by: As directed    BLUE PILLOW UNDER HEEL 30 MIN 4 X A DAY   CPM MACHINE 6 HRS PER DAY START 0-75 INCR 10 PER DAY FOR 2 WEEKS   DRESSING KEEP ON NO SHOWER UNTIL YOU SEE THE DOCTOR   Increase activity slowly as tolerated   Complete by: As directed    Post-operative opioid taper instructions:   Complete by: As directed    POST-OPERATIVE OPIOID TAPER INSTRUCTIONS: It is important to wean off of your opioid medication as soon as possible. If you do not need pain medication after your surgery it is ok to stop day one. Opioids include: Codeine, Hydrocodone(Norco, Vicodin), Oxycodone(Percocet, oxycontin) and hydromorphone amongst others.  Long term and even short term use of opiods can  cause: Increased pain response Dependence Constipation Depression Respiratory depression And more.  Withdrawal symptoms can include Flu like symptoms Nausea, vomiting And more Techniques to manage these symptoms Hydrate well Eat regular healthy meals Stay active Use relaxation techniques(deep breathing, meditating, yoga) Do Not substitute Alcohol to help with tapering If you have been on opioids for less than two weeks and do not have pain than it is ok to stop all together.  Plan to wean off of opioids This plan should start within one week post op of your joint replacement. Maintain the same interval or time between taking each dose and first decrease the dose.  Cut the total daily intake of opioids by one tablet each day Next start to increase the time between doses. The last dose that should be eliminated is the evening dose.         Allergies as of 09/28/2022   No Known Allergies      Medication List     STOP taking these medications    acetaminophen 500 MG tablet Commonly known as: TYLENOL   HYDROcodone-acetaminophen 7.5-325 MG tablet Commonly known as: NORCO Replaced by: HYDROcodone-acetaminophen 10-325 MG tablet   predniSONE 10 MG (48) Tbpk tablet Commonly known as: STERAPRED UNI-PAK 48 TAB       TAKE these medications    aspirin 81 MG chewable tablet Chew 1 tablet (81 mg total) by mouth 2 (two) times daily.   atorvastatin 20 MG tablet Commonly known as: LIPITOR Take 20 mg by mouth daily.   bismuth subsalicylate 99991111 MG chewable tablet Commonly known as: PEPTO BISMOL Chew 524 mg by mouth as needed for indigestion or diarrhea or loose stools.   diclofenac 75 MG EC tablet Commonly known as: VOLTAREN Take 75 mg by mouth 2 (two) times daily as needed for moderate pain.   ferrous sulfate 325 (65 FE) MG tablet Take 325 mg by mouth daily.   hydrochlorothiazide 25 MG tablet Commonly known as: HYDRODIURIL Take 25 mg by mouth daily.    HYDROcodone-acetaminophen 10-325 MG tablet Commonly known as: Norco Take 1 tablet by mouth every 4 (four) hours as needed for up to 5 days. PAIN GREATER THAN 7-10 IF TRAMADOL DOESN'T HELP Replaces: HYDROcodone-acetaminophen 7.5-325 MG tablet   losartan 50 MG tablet Commonly known as: COZAAR Take 50 mg by  mouth daily.   methocarbamol 500 MG tablet Commonly known as: ROBAXIN Take 1 tablet (500 mg total) by mouth every 6 (six) hours as needed for muscle spasms.   polyethylene glycol 17 g packet Commonly known as: MIRALAX / GLYCOLAX Take 17 g by mouth daily as needed for mild constipation.   traMADol 50 MG tablet Commonly known as: ULTRAM Take 1 tablet (50 mg total) by mouth 4 (four) times daily.   TYLENOL SINUS SEVERE PO Take 2 tablets by mouth daily as needed (congestion).   Vitamin D (Ergocalciferol) 1.25 MG (50000 UNIT) Caps capsule Commonly known as: DRISDOL Take 50,000 Units by mouth every Wednesday.        Follow-up Information     Carole Civil, MD Follow up on 10/12/2022.   Specialties: Orthopedic Surgery, Radiology Contact information: [address] [place] [address] [phone]                 Signed: Arther Abbott 09/28/2022, 12:19 PM

## 2022-09-28 NOTE — Telephone Encounter (Signed)
Dr. Ruthe Mannan pt - Stacy [name] w/[facility] [phone] lvm stating she needs information on this patient's surgery, discharge and/or admission.

## 2022-09-28 NOTE — Anesthesia Postprocedure Evaluation (Signed)
Anesthesia Post Note  Patient: Stacy [name]  Procedure(s) Performed: RIGHT TOTAL KNEE ARTHROPLASTY (Right: Knee)  Patient location during evaluation: Phase II Anesthesia Type: Spinal Level of consciousness: awake Pain management: pain level controlled Vital Signs Assessment: post-procedure vital signs reviewed and stable Respiratory status: spontaneous breathing and respiratory function stable Cardiovascular status: blood pressure returned to baseline and stable Postop Assessment: no headache and no apparent nausea or vomiting Anesthetic complications: no Comments: Late entry   No notable events documented.   Last Vitals:  Vitals:   09/27/22 1335 09/28/22 0600  BP: (!) 143/79 102/64  Pulse: 89 93  Resp: 16 19  Temp: 37.2 C 37.4 C  SpO2: 94% 97%    Last Pain:  Vitals:   09/28/22 0600  TempSrc: Oral  PainSc:                  Louann Sjogren

## 2022-09-28 NOTE — Progress Notes (Signed)
Physical Therapy Treatment Patient Details Name: Stacy [name] MRN: [id number] DOB: December 05, 1955 Today's Date: 09/28/2022   RIGHT KNEE ROM:  0 - 90  degrees AMBULATION DISTANCE:  74 feet using RW with Supervision/Mod Independence    History of Present Illness 67 year old female with hypertension presents with bilateral knee pain right greater than left she has severe deformity and arthritis both knees right worse than left.  She has failed nonoperative treatment which included weight loss she HAS  gone under BMI of 40, activity modification, injection, oral NSAID therapy     She is interested in proceeding with right total knee arthroplasty     We discussed the risk and benefits of surgery which include but were not limited to infection, blood clot, pulmonary embolism, postop stiffness and arthrofibrosis.    PT Comments    Patient demonstrates good return for going up/down steps in stairwell using bilateral side rails with step-to pattern without loss of balance and understanding acknowleged by patient and her daughter.  Patient demonstrates fair/good return for right heel to toe stepping during gait training without loss of balance and tolerated sitting up at bedside with RLE dangling with her daughter present after therapy.  Patient will benefit from continued skilled physical therapy in hospital and recommended venue below to increase strength, balance, endurance for safe ADLs and gait.    Recommendations for follow up therapy are one component of a multi-disciplinary discharge planning process, led by the attending physician.  Recommendations may be updated based on patient status, additional functional criteria and insurance authorization.  Follow Up Recommendations       Assistance Recommended at Discharge    Patient can return home with the following A little help with walking and/or transfers;A little help with bathing/dressing/bathroom;Help with stairs or ramp for entrance;Assistance  with cooking/housework   Equipment Recommendations  Rolling walker (2 wheels)    Recommendations for Other Services       Precautions / Restrictions Precautions Precautions: Fall Restrictions Weight Bearing Restrictions: Yes RLE Weight Bearing: Weight bearing as tolerated     Mobility  Bed Mobility Overal bed mobility: Needs Assistance Bed Mobility: Supine to Sit     Supine to sit: Supervision, Modified independent (Device/Increase time)     General bed mobility comments: good return for moving RLE during supine to sitting    Transfers Overall transfer level: Needs assistance Equipment used: Rolling walker (2 wheels) Transfers: Sit to/from Stand, Bed to chair/wheelchair/BSC Sit to Stand: Min assist   Step pivot transfers: Supervision       General transfer comment: required repeated verbal cueing for proper hand placement during sit to stands from bedside, good return for pushing off from armrest using wheelchair    Ambulation/Gait Ambulation/Gait assistance: Supervision Gait Distance (Feet): 80 Feet Assistive device: Rolling walker (2 wheels) Gait Pattern/deviations: Decreased step length - right, Decreased step length - left, Decreased stance time - right, Decreased stride length, Antalgic Gait velocity: decreased     General Gait Details: good return for ambulating in room/hallway with slightly labored cadence and fair/good return for right heel to toe stepping without loss of balance, limited mostly due to c/o fatigue   Stairs Stairs: Yes Stairs assistance: Supervision, Modified independent (Device/Increase time) Stair Management: One rail Right, One rail Left, Two rails, Step to pattern Number of Stairs: 4 General stair comments: demonstrates good return for going up/down steps in stairwell using bilateral side rails with step-to pattern without loss of balance and understanding acknowleged by patient and her daughter  Wheelchair Mobility     Modified Rankin (Stroke Patients Only)       Balance Overall balance assessment: Needs assistance Sitting-balance support: Feet supported, No upper extremity supported Sitting balance-Leahy Scale: Good Sitting balance - Comments: seated EOB   Standing balance support: During functional activity, Bilateral upper extremity supported Standing balance-Leahy Scale: Fair Standing balance comment: fair/good using RW                            Cognition Arousal/Alertness: Awake/alert Behavior During Therapy: WFL for tasks assessed/performed Overall Cognitive Status: Within Functional Limits for tasks assessed                                          Exercises      General Comments        Pertinent Vitals/Pain Pain Assessment Pain Assessment: Faces Faces Pain Scale: Hurts little more Pain Location: Right knee with end range flexion Pain Descriptors / Indicators: Sore, Grimacing, Guarding Pain Intervention(s): Limited activity within patient's tolerance, Monitored during session, Premedicated before session, Repositioned    Home Living                          Prior Function            PT Goals (current goals can now be found in the care plan section) Acute Rehab PT Goals Patient Stated Goal: return home PT Goal Formulation: With patient/family Time For Goal Achievement: 10/10/22 Potential to Achieve Goals: Good Progress towards PT goals: Progressing toward goals    Frequency    BID      PT Plan Current plan remains appropriate    Co-evaluation              AM-PAC PT "6 Clicks" Mobility   Outcome Measure  Help needed turning from your back to your side while in a flat bed without using bedrails?: None Help needed moving from lying on your back to sitting on the side of a flat bed without using bedrails?: None Help needed moving to and from a bed to a chair (including a wheelchair)?: A Little Help needed  standing up from a chair using your arms (e.g., wheelchair or bedside chair)?: A Little Help needed to walk in hospital room?: A Little Help needed climbing 3-5 steps with a railing? : A Little 6 Click Score: 20    End of Session   Activity Tolerance: Patient tolerated treatment well;Patient limited by fatigue Patient left: in bed;with call bell/phone within reach;with family/visitor present Nurse Communication: Mobility status PT Visit Diagnosis: Other abnormalities of gait and mobility (R26.89);Difficulty in walking, not elsewhere classified (R26.2);Pain Pain - Right/Left: Right Pain - part of body: Knee     Time: [id number] PT Time Calculation (min) (ACUTE ONLY): 27 min  Charges:  $Gait Training: 8-22 mins $Therapeutic Activity: 8-22 mins                     10:46 AM, 09/28/22 Lonell Grandchild, MPT Physical Therapist with [facility] [phone] office [phone] mobile phone

## 2022-09-29 ENCOUNTER — Telehealth: Payer: Self-pay | Admitting: Orthopedic Surgery

## 2022-09-29 NOTE — Telephone Encounter (Signed)
Toniann Fail from [facility] left message stating that they have a Dr. Romeo Apple patient that had a total knee replacement done and her bandages has quite a bit of blood on them, but it is not seeping thru.  Would like to speak with someone to see what he would like to be done.  Please call and advise  [phone]   I called Toniann Fail advised if blood not seeping through prefer to leave bandage on but ok to change to dry clean bandage and do daily bandage changes if the blood seeps through had to leave message

## 2022-10-02 LAB — TYPE AND SCREEN
ABO/RH(D): O POS
Antibody Screen: NEGATIVE
Unit division: 0
Unit division: 0

## 2022-10-02 LAB — BPAM RBC
Blood Product Expiration Date: 202405042359
Blood Product Expiration Date: 202405042359
Unit Type and Rh: 5100
Unit Type and Rh: 5100

## 2022-10-03 ENCOUNTER — Encounter (HOSPITAL_COMMUNITY): Payer: Self-pay | Admitting: Orthopedic Surgery

## 2022-10-11 DIAGNOSIS — Z96651 Presence of right artificial knee joint: Secondary | ICD-10-CM | POA: Insufficient documentation

## 2022-10-12 ENCOUNTER — Encounter: Payer: Self-pay | Admitting: Orthopedic Surgery

## 2022-10-12 ENCOUNTER — Ambulatory Visit (INDEPENDENT_AMBULATORY_CARE_PROVIDER_SITE_OTHER): Payer: Medicare PPO | Admitting: Orthopedic Surgery

## 2022-10-12 DIAGNOSIS — Z96651 Presence of right artificial knee joint: Secondary | ICD-10-CM

## 2022-10-12 MED ORDER — TRAMADOL HCL 50 MG PO TABS
50.0000 mg | ORAL_TABLET | Freq: Four times a day (QID) | ORAL | 0 refills | Status: AC
Start: 2022-10-12 — End: 2022-10-17

## 2022-10-12 MED ORDER — HYDROCODONE-ACETAMINOPHEN 5-325 MG PO TABS
1.0000 | ORAL_TABLET | Freq: Four times a day (QID) | ORAL | 0 refills | Status: AC | PRN
Start: 2022-10-12 — End: 2022-10-17

## 2022-10-12 NOTE — Progress Notes (Signed)
Chief Complaint  Patient presents with   Routine Post Op    S/P TKA RT knee  DOS 09/26/22   Postop visit #1 postop day 16 status post right total knee  Upon removing the staples the distal portion of the wound came open.  We expressed several cc of sanguinous drainage  This was treated by evacuation of the hematoma with pressure on the wound edges and then Steri-Stripped and then back together and placing a compression dressing  Will follow-up in Monday to see if this is all we need to do if we do or if we need to do wet-to-dry dressing  Stacy [name] has been doing well she says she is flexing her knee well she is walking well with her walker  Encounter Diagnosis  Name Primary?   S/P TKR (total knee replacement), right 09/26/22 Yes    Meds ordered this encounter  Medications   traMADol (ULTRAM) 50 MG tablet    Sig: Take 1 tablet (50 mg total) by mouth 4 (four) times daily for 5 days.    Dispense:  20 tablet    Refill:  0   HYDROcodone-acetaminophen (NORCO/VICODIN) 5-325 MG tablet    Sig: Take 1 tablet by mouth every 6 (six) hours as needed for up to 5 days for moderate pain.    Dispense:  20 tablet    Refill:  0   Medication refills and reductions in opioid load as noted above

## 2022-10-16 ENCOUNTER — Ambulatory Visit (INDEPENDENT_AMBULATORY_CARE_PROVIDER_SITE_OTHER): Payer: Medicare PPO | Admitting: Orthopedic Surgery

## 2022-10-16 DIAGNOSIS — Z96651 Presence of right artificial knee joint: Secondary | ICD-10-CM

## 2022-10-16 DIAGNOSIS — L7682 Other postprocedural complications of skin and subcutaneous tissue: Secondary | ICD-10-CM

## 2022-10-16 NOTE — Progress Notes (Signed)
   Chief Complaint  Patient presents with   Knee Pain   Routine Post Op    TKA - Right DOS 09/26/22    Stacy [name] is postop from her total knee she had the inferior portion of the incision opened up with some sanguinous drainage.  Steri-Strips were applied and the wound seems to be fine at this point however I will need to see her again in a week she can only come on Thursday afternoons so I will see her then to check the wound.  The dressing is a nonstick dressing and needs to stay on  Do not remove the Steri-Strips until Dr. Romeo Apple sees the wound

## 2022-10-23 ENCOUNTER — Telehealth: Payer: Self-pay | Admitting: Orthopedic Surgery

## 2022-10-23 NOTE — Telephone Encounter (Signed)
I called her she said she does have a nurse and the nurse usually has bandage supplies I told patient ok for the nurse to change bandage per Dr Romeo Apple and we will see her Thursday

## 2022-10-23 NOTE — Telephone Encounter (Signed)
Dr. Mort Sawyers pt - I called the pt and advised she needs to come today per Amy and she stated that she had no transportation, that she'll have to wait until her scheduled appointment on Thursday.

## 2022-10-23 NOTE — Telephone Encounter (Signed)
Dr. Mort Sawyers pt - spoke w/the patient, she stated that Dr. Romeo Apple advised her to leave her bandage on for a week, today is a week.  She stated it has an odor like it needs to be changed.  She is wanting to know if her home health nurse can change it today.  [phone]

## 2022-10-23 NOTE — Telephone Encounter (Signed)
If it has odor she needs to come here today have [place] change, please

## 2022-10-23 NOTE — Telephone Encounter (Signed)
NURSE HOME HEALTH CAN CHANGE THE DRESSING

## 2022-10-25 ENCOUNTER — Telehealth: Payer: Self-pay | Admitting: Orthopedic Surgery

## 2022-10-25 NOTE — Telephone Encounter (Signed)
Ok I will call her I was worried Monday when she had odor but patient declined to come in

## 2022-10-25 NOTE — Telephone Encounter (Signed)
Dr. Mort Sawyers pt - Stacy [name] a nurse w[facility] of [place], [place] [phone] lvm stating that she went to change the bandage and put steri strips on the postop right knee, she said sad to say it's infected, has an awful odor, very red around the edges, drainage, she wants to put some type of dressing over it until her appointment tomorrow.  I couldn't understand her.  She would like a call back.

## 2022-10-26 ENCOUNTER — Encounter: Payer: Self-pay | Admitting: Orthopedic Surgery

## 2022-10-26 ENCOUNTER — Telehealth: Payer: Self-pay | Admitting: Orthopedic Surgery

## 2022-10-26 ENCOUNTER — Ambulatory Visit (INDEPENDENT_AMBULATORY_CARE_PROVIDER_SITE_OTHER): Payer: Medicare PPO | Admitting: Orthopedic Surgery

## 2022-10-26 DIAGNOSIS — Z96651 Presence of right artificial knee joint: Secondary | ICD-10-CM

## 2022-10-26 DIAGNOSIS — G8918 Other acute postprocedural pain: Secondary | ICD-10-CM

## 2022-10-26 DIAGNOSIS — T8131XA Disruption of external operation (surgical) wound, not elsewhere classified, initial encounter: Secondary | ICD-10-CM

## 2022-10-26 MED ORDER — HYDROCODONE-ACETAMINOPHEN 5-325 MG PO TABS: 1.0000 | ORAL_TABLET | Freq: Four times a day (QID) | ORAL | 0 refills | Status: AC | PRN

## 2022-10-26 MED ORDER — DOXYCYCLINE HYCLATE 100 MG PO TABS: 100.0000 mg | ORAL_TABLET | Freq: Two times a day (BID) | ORAL | 1 refills | Status: DC

## 2022-10-26 NOTE — Progress Notes (Signed)
    This is a postop visit  Encounter Diagnosis  Name Primary?   S/P TKR (total knee replacement), right 09/26/22 Yes    Chief Complaint  Patient presents with   Post-op Problem    Wound check s/p right TKR 09/26/22 has had drainage nurse removed steristrips yesterday was concerned about odor    The patient came in today with some breakdown of her wound at the distal portion.  There is no effusion or redness around the knee joint.  There is no exposed bone  Recommend Telfa dressing every other day dressing change doxycycline  She is only taking 1 pain pill per day so I do not think we have an infection of the joint  Meds ordered this encounter  Medications   doxycycline (VIBRA-TABS) 100 MG tablet    Sig: Take 1 tablet (100 mg total) by mouth 2 (two) times daily.    Dispense:  28 tablet    Refill:  1   HYDROcodone-acetaminophen (NORCO/VICODIN) 5-325 MG tablet    Sig: Take 1 tablet by mouth every 6 (six) hours as needed for up to 5 days for moderate pain.    Dispense:  20 tablet    Refill:  0

## 2022-10-26 NOTE — Telephone Encounter (Signed)
Dr. Mort Sawyers pt - pt lvm stating that we need to fax an order to [facility] for them to change her bandage every two day.

## 2022-10-26 NOTE — Telephone Encounter (Signed)
Put in order sent to [facility]al[facility] Sago

## 2022-11-02 ENCOUNTER — Encounter: Payer: Self-pay | Admitting: Orthopedic Surgery

## 2022-11-02 ENCOUNTER — Ambulatory Visit (INDEPENDENT_AMBULATORY_CARE_PROVIDER_SITE_OTHER): Payer: Medicare PPO | Admitting: Orthopedic Surgery

## 2022-11-02 DIAGNOSIS — G8918 Other acute postprocedural pain: Secondary | ICD-10-CM

## 2022-11-02 DIAGNOSIS — Z96651 Presence of right artificial knee joint: Secondary | ICD-10-CM

## 2022-11-02 DIAGNOSIS — M19072 Primary osteoarthritis, left ankle and foot: Secondary | ICD-10-CM

## 2022-11-02 DIAGNOSIS — M19079 Primary osteoarthritis, unspecified ankle and foot: Secondary | ICD-10-CM

## 2022-11-02 DIAGNOSIS — T8131XA Disruption of external operation (surgical) wound, not elsewhere classified, initial encounter: Secondary | ICD-10-CM

## 2022-11-02 MED ORDER — DICLOFENAC SODIUM 75 MG PO TBEC
75.0000 mg | DELAYED_RELEASE_TABLET | Freq: Two times a day (BID) | ORAL | 1 refills | Status: DC | PRN
Start: 2022-11-02 — End: 2022-12-18

## 2022-11-02 MED ORDER — HYDROCODONE-ACETAMINOPHEN 5-325 MG PO TABS
1.0000 | ORAL_TABLET | Freq: Four times a day (QID) | ORAL | 0 refills | Status: DC | PRN
Start: 2022-11-02 — End: 2022-11-16

## 2022-11-02 NOTE — Progress Notes (Signed)
Chief Complaint  Patient presents with   Wound Check    RT TKA 09/26/22 large amount of drainage on bandage    Foot Pain    Left foot painful today thinks gout is flaring    Postop total knee with distal wound breakdown  Currently getting dressing changes every other day  Patient seen by [facility] nursing [phone]  I will speak to her regarding a more absorbent dressing  Patient will return in a week  Continue antibiotic doxycycline  Hydrocodone was distributed mainly for pain in the patient's left great toe which is most likely arthritis and not gout  Patient has a prominence over the left great toe at the MTP joint there is some reactive bursitis there is well with some range of motion limitations but no evidence of heat or hypersensitivity to touch  Not gout  Meds ordered this encounter  Medications   diclofenac (VOLTAREN) 75 MG EC tablet    Sig: Take 1 tablet (75 mg total) by mouth 2 (two) times daily as needed for moderate pain.    Dispense:  60 tablet    Refill:  1   HYDROcodone-acetaminophen (NORCO/VICODIN) 5-325 MG tablet    Sig: Take 1 tablet by mouth every 6 (six) hours as needed for moderate pain.    Dispense:  30 tablet    Refill:  0

## 2022-11-02 NOTE — Patient Instructions (Signed)
Continue the antibiotic  Stay on the pain medication as needed for the great toe pain  Use ice as needed  I will speak to [facility] regarding the dressing  Return in 1 week

## 2022-11-09 ENCOUNTER — Ambulatory Visit (INDEPENDENT_AMBULATORY_CARE_PROVIDER_SITE_OTHER): Payer: Medicare PPO | Admitting: Orthopedic Surgery

## 2022-11-09 DIAGNOSIS — T8131XA Disruption of external operation (surgical) wound, not elsewhere classified, initial encounter: Secondary | ICD-10-CM

## 2022-11-09 DIAGNOSIS — Z96651 Presence of right artificial knee joint: Secondary | ICD-10-CM

## 2022-11-09 MED ORDER — DOXYCYCLINE HYCLATE 100 MG PO TABS: 100.0000 mg | ORAL_TABLET | Freq: Two times a day (BID) | ORAL | 1 refills | Status: DC

## 2022-11-09 MED ORDER — MEDIHONEY WOUND/BURN DRESSING EX PSTE
1.0000 | PASTE | Freq: Every day | CUTANEOUS | 1 refills | Status: DC
Start: 2022-11-09 — End: 2023-04-30

## 2022-11-09 NOTE — Addendum Note (Signed)
Addended byCaffie Damme on: 11/09/2022 03:15 PM   Modules accepted: Orders

## 2022-11-09 NOTE — Progress Notes (Signed)
Chief Complaint  Patient presents with   Wound Check    09/26/22 right total knee nurse asking about using med honey said to send orders if ok   Encounter Diagnoses  Name Primary?   S/P TKR (total knee replacement), right 09/26/22 Yes   Dehiscence of operative wound, initial encounter    Call placed to try to speak with the wound care nurse regarding Ms. Evon  Pictures are in the chart related to her wound  I am in agreement with trying to use Medihoney  The patient is currently diclofenac 100 mg twice a day  Meds ordered this encounter  Medications   leptospermum manuka honey (MEDIHONEY) PSTE paste    Sig: Apply 1 Application topically daily.    Dispense:  44 mL    Refill:  1   doxycycline (VIBRA-TABS) 100 MG tablet    Sig: Take 1 tablet (100 mg total) by mouth 2 (two) times daily.    Dispense:  28 tablet    Refill:  1

## 2022-11-13 ENCOUNTER — Other Ambulatory Visit: Payer: Self-pay | Admitting: Orthopedic Surgery

## 2022-11-13 DIAGNOSIS — G8929 Other chronic pain: Secondary | ICD-10-CM

## 2022-11-15 ENCOUNTER — Other Ambulatory Visit: Payer: Self-pay | Admitting: Orthopedic Surgery

## 2022-11-15 DIAGNOSIS — G8918 Other acute postprocedural pain: Secondary | ICD-10-CM

## 2022-11-15 DIAGNOSIS — Z96651 Presence of right artificial knee joint: Secondary | ICD-10-CM

## 2022-11-23 ENCOUNTER — Ambulatory Visit (INDEPENDENT_AMBULATORY_CARE_PROVIDER_SITE_OTHER): Payer: Medicare PPO | Admitting: Orthopedic Surgery

## 2022-11-23 DIAGNOSIS — T8131XA Disruption of external operation (surgical) wound, not elsewhere classified, initial encounter: Secondary | ICD-10-CM

## 2022-11-23 DIAGNOSIS — Z96651 Presence of right artificial knee joint: Secondary | ICD-10-CM

## 2022-11-23 NOTE — Progress Notes (Signed)
Chief Complaint  Patient presents with   Post-op Follow-up    Right knee 09/26/22 wound check using honey     Encounter Diagnoses  Name Primary?   S/P TKR (total knee replacement), right 09/26/22 Yes   Dehiscence of operative wound, initial encounter    67 year old female status post right total knee had wound breakdown distally now getting honey dressing  Wound looks really good patient has no knee pain  She does complain of some back pain she is tender in the lower back I told her to put some heat on it use her muscle relaxer little bit more follow-up in 2 weeks to check the wound

## 2022-12-01 ENCOUNTER — Other Ambulatory Visit: Payer: Self-pay | Admitting: Orthopedic Surgery

## 2022-12-01 DIAGNOSIS — Z96651 Presence of right artificial knee joint: Secondary | ICD-10-CM

## 2022-12-02 ENCOUNTER — Other Ambulatory Visit: Payer: Self-pay | Admitting: Orthopedic Surgery

## 2022-12-04 ENCOUNTER — Other Ambulatory Visit: Payer: Self-pay | Admitting: Orthopedic Surgery

## 2022-12-04 DIAGNOSIS — G8918 Other acute postprocedural pain: Secondary | ICD-10-CM

## 2022-12-04 DIAGNOSIS — Z96651 Presence of right artificial knee joint: Secondary | ICD-10-CM

## 2022-12-07 ENCOUNTER — Encounter: Payer: Medicare PPO | Admitting: Orthopedic Surgery

## 2022-12-14 ENCOUNTER — Encounter: Payer: Medicare PPO | Admitting: Orthopedic Surgery

## 2022-12-15 ENCOUNTER — Encounter: Payer: Medicare PPO | Admitting: Orthopedic Surgery

## 2022-12-15 DIAGNOSIS — Z96651 Presence of right artificial knee joint: Secondary | ICD-10-CM

## 2022-12-17 ENCOUNTER — Other Ambulatory Visit: Payer: Self-pay | Admitting: Orthopedic Surgery

## 2022-12-17 DIAGNOSIS — M19079 Primary osteoarthritis, unspecified ankle and foot: Secondary | ICD-10-CM

## 2022-12-21 ENCOUNTER — Other Ambulatory Visit: Payer: Self-pay | Admitting: Orthopedic Surgery

## 2022-12-21 DIAGNOSIS — G8918 Other acute postprocedural pain: Secondary | ICD-10-CM

## 2022-12-21 DIAGNOSIS — Z96651 Presence of right artificial knee joint: Secondary | ICD-10-CM

## 2022-12-25 ENCOUNTER — Ambulatory Visit (INDEPENDENT_AMBULATORY_CARE_PROVIDER_SITE_OTHER): Payer: Medicare PPO | Admitting: Orthopedic Surgery

## 2022-12-25 DIAGNOSIS — Z96651 Presence of right artificial knee joint: Secondary | ICD-10-CM

## 2022-12-25 DIAGNOSIS — M2550 Pain in unspecified joint: Secondary | ICD-10-CM

## 2022-12-25 DIAGNOSIS — G8929 Other chronic pain: Secondary | ICD-10-CM

## 2022-12-25 DIAGNOSIS — T8131XA Disruption of external operation (surgical) wound, not elsewhere classified, initial encounter: Secondary | ICD-10-CM

## 2022-12-25 MED ORDER — PREDNISONE 10 MG PO TABS
10.0000 mg | ORAL_TABLET | Freq: Three times a day (TID) | ORAL | 0 refills | Status: DC
Start: 2022-12-25 — End: 2023-02-05

## 2022-12-25 MED ORDER — HYDROCODONE-ACETAMINOPHEN 5-325 MG PO TABS
1.0000 | ORAL_TABLET | Freq: Four times a day (QID) | ORAL | 0 refills | Status: DC | PRN
Start: 2022-12-25 — End: 2023-04-30

## 2022-12-25 NOTE — Progress Notes (Signed)
   Chief Complaint  Patient presents with   Knee Pain    Right- doing good my whole body is sore and stiff    Encounter Diagnoses  Name Primary?   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  She can take some prednisone 3 times a day to help with inflammation and then the pain management center can manage her chronic opioid situation  Meds ordered this encounter  Medications   predniSONE (DELTASONE) 10 MG tablet    Sig: Take 1 tablet (10 mg total) by mouth 3 (three) times daily.    Dispense:  42 tablet    Refill:  0   HYDROcodone-acetaminophen (NORCO/VICODIN) 5-325 MG tablet    Sig: Take 1 tablet by mouth every 6 (six) hours as needed for moderate pain.    Dispense:  30 tablet    Refill:  0   I need to see her in 4 weeks for the knee wound

## 2022-12-26 ENCOUNTER — Telehealth: Payer: Self-pay | Admitting: Radiology

## 2022-12-26 DIAGNOSIS — G8929 Other chronic pain: Secondary | ICD-10-CM

## 2022-12-26 DIAGNOSIS — Z96651 Presence of right artificial knee joint: Secondary | ICD-10-CM

## 2022-12-26 NOTE — Telephone Encounter (Signed)
-----   Message from Vickki Hearing, MD sent at 12/25/2022  5:01 PM EDT ----- Pain management referral

## 2023-01-22 ENCOUNTER — Ambulatory Visit: Payer: Medicare PPO | Admitting: Orthopedic Surgery

## 2023-01-22 VITALS — BP 129/80 | HR 85

## 2023-01-22 DIAGNOSIS — M1712 Unilateral primary osteoarthritis, left knee: Secondary | ICD-10-CM | POA: Diagnosis not present

## 2023-01-22 DIAGNOSIS — T8131XA Disruption of external operation (surgical) wound, not elsewhere classified, initial encounter: Secondary | ICD-10-CM

## 2023-01-22 DIAGNOSIS — Z96651 Presence of right artificial knee joint: Secondary | ICD-10-CM

## 2023-01-22 MED ORDER — METHYLPREDNISOLONE ACETATE 40 MG/ML IJ SUSP
40.0000 mg | Freq: Once | INTRAMUSCULAR | Status: AC
  Administered 2023-01-22: 40 mg via INTRA_ARTICULAR

## 2023-01-22 NOTE — Progress Notes (Signed)
Chief Complaint  Patient presents with   Post-op Problem    Wound check right knee replacement incision using medihoney still has tiny spot not completely healed    Current Outpatient Medications  Medication Instructions   aspirin 81 mg, Oral, 2 times daily   atorvastatin (LIPITOR) 20 mg, Oral, Daily   bismuth subsalicylate (PEPTO BISMOL) 524 mg, Oral, As needed   diclofenac (VOLTAREN) 75 MG EC tablet TAKE 1 TABLET BY MOUTH TWICE DAILY AS NEEDED FOR PAIN (moderate)   doxycycline (VIBRA-TABS) 100 mg, Oral, 2 times daily   ferrous sulfate 325 mg, Oral, Daily   hydrochlorothiazide (HYDRODIURIL) 25 mg, Oral, Daily   HYDROcodone-acetaminophen (NORCO/VICODIN) 5-325 MG tablet 1 tablet, Oral, Every 6 hours PRN   leptospermum manuka honey (MEDIHONEY) PSTE paste 1 Application, Topical, Daily   losartan (COZAAR) 50 mg, Oral, Daily   methocarbamol (ROBAXIN) 500 mg, Oral, Every 6 hours PRN   Phenylephrine-APAP-guaiFENesin (TYLENOL SINUS SEVERE PO) 2 tablets, Oral, Daily PRN   polyethylene glycol (MIRALAX / GLYCOLAX) 17 g, Oral, Daily PRN   predniSONE (DELTASONE) 10 mg, Oral, 3 times daily   Vitamin D (Ergocalciferol) (DRISDOL) 50,000 Units, Oral, Every Wed   Encounter Diagnoses  Name Primary?   status post right total knee arthroplasty 09/26/2022    Primary osteoarthritis of left knee Yes   Dehiscence of operative wound, initial encounter     The patient's right knee wound from the knee replacement is doing great see pictures in the media  Comes in today with new complaint of left knee pain of 2 weeks duration described as chronic aching came on suddenly without any trauma  Examination of the left knee shows no effusion painful crepitance on range of motion knee is otherwise stable  Recommend injection left knee  Patient will let me know if she wants anything done with the knee  Procedure note left knee injection   verbal consent was obtained to inject left knee joint  Timeout was  completed to confirm the site of injection  The medications used were depomedrol 40 mg and 1% lidocaine 3 cc Anesthesia was provided by ethyl chloride and the skin was prepped with alcohol.  After cleaning the skin with alcohol a 20-gauge needle was used to inject the left knee joint. There were no complications. A sterile bandage was applied.

## 2023-02-05 ENCOUNTER — Other Ambulatory Visit: Payer: Self-pay | Admitting: Orthopedic Surgery

## 2023-02-05 DIAGNOSIS — G8929 Other chronic pain: Secondary | ICD-10-CM

## 2023-03-14 ENCOUNTER — Other Ambulatory Visit: Payer: Self-pay | Admitting: Orthopedic Surgery

## 2023-03-14 DIAGNOSIS — G8929 Other chronic pain: Secondary | ICD-10-CM

## 2023-03-23 ENCOUNTER — Other Ambulatory Visit: Payer: Self-pay | Admitting: Orthopedic Surgery

## 2023-03-23 DIAGNOSIS — G8929 Other chronic pain: Secondary | ICD-10-CM

## 2023-04-26 ENCOUNTER — Ambulatory Visit: Payer: Medicare PPO | Admitting: Orthopedic Surgery

## 2023-04-30 ENCOUNTER — Other Ambulatory Visit: Payer: Self-pay | Admitting: Orthopedic Surgery

## 2023-04-30 ENCOUNTER — Ambulatory Visit: Payer: Medicare PPO | Admitting: Orthopedic Surgery

## 2023-04-30 ENCOUNTER — Encounter: Payer: Self-pay | Admitting: Orthopedic Surgery

## 2023-04-30 ENCOUNTER — Other Ambulatory Visit (INDEPENDENT_AMBULATORY_CARE_PROVIDER_SITE_OTHER): Payer: Self-pay

## 2023-04-30 VITALS — Ht 64.0 in | Wt 229.0 lb

## 2023-04-30 DIAGNOSIS — M1712 Unilateral primary osteoarthritis, left knee: Secondary | ICD-10-CM

## 2023-04-30 NOTE — Progress Notes (Signed)
Chief Complaint  Patient presents with   Knee Pain    Left    67 yo f with disabling left knee pain s/p successful tka right knee presents with ongoing severe pain in her left knee.  She would like to consider surgical intervention at this time  However, her husband has been having a major illness and she has been taking care of him.  She says that she will get back to [place] regarding surgery    Past Medical History:  Diagnosis Date   Arthritis    Chronic kidney disease    Hypertension    Past Surgical History:  Procedure Laterality Date   CHOLECYSTECTOMY     TOTAL KNEE ARTHROPLASTY Right 09/26/2022   Procedure: RIGHT TOTAL KNEE ARTHROPLASTY;  Surgeon: Vickki Hearing, MD;  Location: [facility];  Service: Orthopedics;  Laterality: Right;   TUBAL LIGATION     Current Outpatient Medications  Medication Instructions   atorvastatin (LIPITOR) 20 mg, Oral, Daily   diclofenac (VOLTAREN) 75 MG EC tablet TAKE 1 TABLET BY MOUTH TWICE DAILY AS NEEDED FOR PAIN (moderate)   ferrous sulfate 325 mg, Oral, Daily   hydrochlorothiazide (HYDRODIURIL) 25 mg, Oral, Daily   losartan (COZAAR) 50 mg, Oral, Daily   traMADol (ULTRAM) 50 mg, Oral, 3 times daily PRN   Vitamin D (Ergocalciferol) (DRISDOL) 50,000 Units, Oral, Every Wed    Ht 5\' 4"  (1.626 m)   Wt 229 lb (103.9 kg)   BMI 39.31 kg/m   Left knee exam  Skin is intact good skin envelope  Medial joint line tenderness mild varus alignment small joint effusion  Knee comes to full extension flexion arc is 120 degrees knee is stable  Imaging today shows severe arthritis of the left knee see dictated report  Patient would like to proceed with left total knee arthroplasty at her convenience

## 2023-05-03 ENCOUNTER — Telehealth: Payer: Self-pay | Admitting: Orthopedic Surgery

## 2023-05-03 ENCOUNTER — Other Ambulatory Visit: Payer: Self-pay | Admitting: Orthopedic Surgery

## 2023-05-03 DIAGNOSIS — G8929 Other chronic pain: Secondary | ICD-10-CM

## 2023-05-03 NOTE — Telephone Encounter (Signed)
Dr. Mort Sawyers pt - spoke w/the pt, she stated she was discussing surgery w/Dr. Romeo Apple and she wants to know if she has surgery in December, what date would we be looking at.  [phone]

## 2023-05-04 NOTE — Telephone Encounter (Signed)
Discussed with Dr Rexene Edison. Only day left is Dec 18th but we are holding that day he will check and let me know.

## 2023-05-04 NOTE — Telephone Encounter (Signed)
NO LETS LOOK AND SEE A DAY AFTER CLINIC

## 2023-05-08 ENCOUNTER — Other Ambulatory Visit: Payer: Self-pay | Admitting: Orthopedic Surgery

## 2023-05-08 DIAGNOSIS — M1712 Unilateral primary osteoarthritis, left knee: Secondary | ICD-10-CM

## 2023-05-08 DIAGNOSIS — Z01818 Encounter for other preprocedural examination: Secondary | ICD-10-CM

## 2023-05-08 MED ORDER — BUPIVACAINE-MELOXICAM ER 400-12 MG/14ML IJ SOLN: 400.0000 mg | Freq: Once | INTRAMUSCULAR | Status: AC

## 2023-05-08 NOTE — Telephone Encounter (Signed)
I called her and she is okay to wait until January  She lives in [place] Last time she only did the home health physical therapy and did not go to outpatient therapy since she does not have transportation would like to do same this time as well.

## 2023-05-09 NOTE — Telephone Encounter (Signed)
Called pt to advise

## 2023-05-09 NOTE — Telephone Encounter (Signed)
Wait till January   Needs to be pr op and weighed   Need to be 215lbs

## 2023-06-07 ENCOUNTER — Encounter: Payer: Self-pay | Admitting: Orthopedic Surgery

## 2023-06-07 ENCOUNTER — Ambulatory Visit: Payer: Medicare PPO | Admitting: Orthopedic Surgery

## 2023-06-07 VITALS — BP 172/99 | HR 64 | Ht 63.0 in | Wt 221.0 lb

## 2023-06-07 DIAGNOSIS — M1712 Unilateral primary osteoarthritis, left knee: Secondary | ICD-10-CM | POA: Diagnosis not present

## 2023-06-07 DIAGNOSIS — Z01818 Encounter for other preprocedural examination: Secondary | ICD-10-CM | POA: Diagnosis not present

## 2023-06-07 MED ORDER — ACETAMINOPHEN-CODEINE 300-30 MG PO TABS: 1.0000 | ORAL_TABLET | Freq: Four times a day (QID) | ORAL | 0 refills | Status: DC | PRN

## 2023-06-07 NOTE — Progress Notes (Signed)
FOLLOW UP   Encounter Diagnosis  Name Primary?   Primary osteoarthritis of left knee Yes    Chief Complaint  Patient presents with   Knee Pain    Patient having pain in the left knee which comes from the hip at times to the knee  x 1 1/2 weeks now wants to discuss surgery    BP (!) 172/99 (Cuff Size: Small)   Pulse 64   Ht 5\' 3"  (1.6 m)   Wt 221 lb (100.2 kg)   BMI 39.15 kg/m    Preop evaluation for left total knee  Complains of severe disabling left knee pain unresponsive to nonoperative measures.  Currently having some radicular-like symptoms unrelieved by tramadol  Range of motion is 0-3-120 degrees  Knee envelope looks normal  Instability none  Tenderness medial compartment  Altered gait favoring left side  Physical Exam Vitals and nursing note reviewed.  Constitutional:      Appearance: Normal appearance.  HENT:     Head: Normocephalic and atraumatic.  Eyes:     General: No scleral icterus.       Right eye: No discharge.        Left eye: No discharge.     Extraocular Movements: Extraocular movements intact.     Conjunctiva/sclera: Conjunctivae normal.     Pupils: Pupils are equal, round, and reactive to light.  Cardiovascular:     Rate and Rhythm: Normal rate.     Pulses: Normal pulses.  Skin:    General: Skin is warm and dry.     Capillary Refill: Capillary refill takes less than 2 seconds.  Neurological:     General: No focal deficit present.     Mental Status: She is alert and oriented to person, place, and time.  Psychiatric:        Mood and Affect: Mood normal.        Behavior: Behavior normal.        Thought Content: Thought content normal.        Judgment: Judgment normal.     X-ray report  Chief complaint knee pain  Images 3 views left knee  Reading: Severe varus alignment to the left knee, bone-on-bone disease on the medial compartment with joint subluxation osteophyte formation subchondral sclerosis cyst formation with periarticular  osteophytes around the patella but normal patellar alignment without tilt  Impression: Severe OA grade 4 left knee   Left total knee arthroplasty for left knee osteoarthritis  Encounter Diagnosis  Name Primary?   Primary osteoarthritis of left knee Yes   Meds ordered this encounter  Medications   acetaminophen-codeine (TYLENOL #3) 300-30 MG tablet    Sig: Take 1 tablet by mouth every 6 (six) hours as needed for moderate pain (pain score 4-6).    Dispense:  30 tablet    Refill:  0

## 2023-06-12 ENCOUNTER — Other Ambulatory Visit: Payer: Self-pay | Admitting: Orthopedic Surgery

## 2023-06-12 DIAGNOSIS — G8929 Other chronic pain: Secondary | ICD-10-CM

## 2023-06-13 ENCOUNTER — Other Ambulatory Visit: Payer: Self-pay | Admitting: Orthopedic Surgery

## 2023-06-13 DIAGNOSIS — G8929 Other chronic pain: Secondary | ICD-10-CM

## 2023-06-14 ENCOUNTER — Telehealth: Payer: Self-pay

## 2023-06-14 NOTE — Telephone Encounter (Signed)
I called her to advise and she asked what she can take for the aching and pain in her feet at night

## 2023-06-14 NOTE — Telephone Encounter (Signed)
Patient is asking for a refill on Prednisone  PATIENT USES [facility]

## 2023-06-14 NOTE — Telephone Encounter (Signed)
I talked to her this morning sent message to Dr Romeo Apple  Another message not necessary / already sent for review/ closing this one see other message today.

## 2023-06-14 NOTE — Telephone Encounter (Signed)
Patient states the Tylenol #3 is not working. States that her left leg and foot are swollen and she can't put any pressure on it. That is why she is asking for the Prednisone

## 2023-06-26 NOTE — Telephone Encounter (Signed)
 Elevate and tylenol

## 2023-06-28 ENCOUNTER — Other Ambulatory Visit: Payer: Self-pay

## 2023-06-28 ENCOUNTER — Encounter (HOSPITAL_COMMUNITY)
Admission: RE | Admit: 2023-06-28 | Discharge: 2023-06-28 | Disposition: A | Discharge status: Home / Self Care | Payer: Medicare PPO | Source: Ambulatory Visit | Attending: Orthopedic Surgery | Admitting: Orthopedic Surgery

## 2023-06-28 ENCOUNTER — Encounter (HOSPITAL_COMMUNITY): Payer: Self-pay

## 2023-06-28 ENCOUNTER — Telehealth: Payer: Self-pay

## 2023-06-28 VITALS — BP 149/75 | HR 64 | Temp 97.7°F | Resp 18 | Ht 63.0 in | Wt 221.0 lb

## 2023-06-28 DIAGNOSIS — Z01818 Encounter for other preprocedural examination: Secondary | ICD-10-CM | POA: Diagnosis not present

## 2023-06-28 DIAGNOSIS — Z01810 Encounter for preprocedural cardiovascular examination: Secondary | ICD-10-CM | POA: Diagnosis present

## 2023-06-28 DIAGNOSIS — M1712 Unilateral primary osteoarthritis, left knee: Secondary | ICD-10-CM

## 2023-06-28 DIAGNOSIS — Z01812 Encounter for preprocedural laboratory examination: Secondary | ICD-10-CM | POA: Diagnosis present

## 2023-06-28 DIAGNOSIS — I10 Essential (primary) hypertension: Secondary | ICD-10-CM

## 2023-06-28 LAB — CBC WITH DIFFERENTIAL/PLATELET
Abs Immature Granulocytes: 0.03 10*3/uL (ref 0.00–0.07)
Basophils Absolute: 0 10*3/uL (ref 0.0–0.1)
Basophils Relative: 0 %
Eosinophils Absolute: 0.1 10*3/uL (ref 0.0–0.5)
Eosinophils Relative: 2 %
HCT: 41.7 % (ref 36.0–46.0)
Hemoglobin: 12.9 g/dL (ref 12.0–15.0)
Immature Granulocytes: 0 %
Lymphocytes Relative: 32 %
Lymphs Abs: 2.9 10*3/uL (ref 0.7–4.0)
MCH: 27.9 pg (ref 26.0–34.0)
MCHC: 30.9 g/dL (ref 30.0–36.0)
MCV: 90.1 fL (ref 80.0–100.0)
Monocytes Absolute: 0.5 10*3/uL (ref 0.1–1.0)
Monocytes Relative: 6 %
Neutro Abs: 5.3 10*3/uL (ref 1.7–7.7)
Neutrophils Relative %: 60 %
Platelets: 317 10*3/uL (ref 150–400)
RBC: 4.63 MIL/uL (ref 3.87–5.11)
RDW: 15.7 % — ABNORMAL HIGH (ref 11.5–15.5)
WBC: 8.8 10*3/uL (ref 4.0–10.5)
nRBC: 0 % (ref 0.0–0.2)

## 2023-06-28 LAB — BASIC METABOLIC PANEL
Anion gap: 11 (ref 5–15)
BUN: 30 mg/dL — ABNORMAL HIGH (ref 8–23)
CO2: 24 mmol/L (ref 22–32)
Calcium: 9.6 mg/dL (ref 8.9–10.3)
Chloride: 106 mmol/L (ref 98–111)
Creatinine, Ser: 1.17 mg/dL — ABNORMAL HIGH (ref 0.44–1.00)
GFR, Estimated: 51 mL/min — ABNORMAL LOW (ref >60)
Glucose, Bld: 85 mg/dL (ref 70–99)
Potassium: 3.4 mmol/L — ABNORMAL LOW (ref 3.5–5.1)
Sodium: 141 mmol/L (ref 135–145)

## 2023-06-28 LAB — SURGICAL PCR SCREEN
MRSA, PCR: NEGATIVE
Staphylococcus aureus: NEGATIVE

## 2023-06-28 LAB — PREPARE RBC (CROSSMATCH)

## 2023-06-28 NOTE — Patient Instructions (Signed)
 Stacy [name]  06/28/2023     @[email or web address] @   Your procedure is scheduled on 07/03/2023.   Report to [facility] at 6:00 A.M.   Call this number if you have problems the morning of surgery:  [phone]  If you experience any cold or flu symptoms such as cough, fever, chills, shortness of breath, etc. between now and your scheduled surgery, please notify us  at the above number.   Remember:   Do not eat anything after midnight.   You may drink clear liquids until 3:30am .  Clear liquids allowed are:                    Water , Carbonated beverages (diabetics please choose diet or no sugar options), and Clear Sports drink (No red color; diabetics please choose diet or no sugar options)    Take these medicines the morning of surgery with A SIP OF WATER  : Tramadol  if needed    Do not wear jewelry, make-up or nail polish, including gel polish,  artificial nails, or any other type of covering on natural nails (fingers and  toes).  Do not wear lotions, powders, or perfumes, or deodorant.  Do not shave 48 hours prior to surgery.  Men may shave face and neck.  Do not bring valuables to the hospital.  [facility] is not responsible for any belongings or valuables.  Contacts, dentures or bridgework may not be worn into surgery.  Leave your suitcase in the car.  After surgery it may be brought to your room.  For patients admitted to the hospital, discharge time will be determined by your treatment team.  Patients discharged the day of surgery will not be allowed to drive home.   Name and phone number of your driver:   Family Special instructions:  N/A  Please read over the following fact sheets that you were given. Care and Recovery After Surgery  Total Knee Replacement  Total knee replacement is a procedure to replace the damaged knee joint with an artificial (prosthetic) knee joint. The purpose of this surgery is to reduce knee pain and improve knee function. The prosthetic  knee joint (prosthesis) may be made of metal, plastic, or ceramic. It replaces parts of the thigh bone (femur), lower leg bone (tibia), and kneecap (patella) that are removed during the procedure. Tell a health care provider about: Any allergies you have. All medicines you are taking, including vitamins, herbs, eye drops, creams, and over-the-counter medicines. Any problems you or family members have had with anesthetic medicines. Any blood disorders you have. Any surgeries you have had. Any medical conditions you have. Whether you are pregnant or may be pregnant. What are the risks? Generally, this is a safe procedure. However, problems may occur, including: Infection. Bleeding. A blood clot that forms in your leg. The clot may break loose and travel to your lungs (pulmonary embolism). Allergic reactions to medicines. Damage to nerves or other structures. Problems with the knee, such as: Decreased range of motion of the knee. Instability of the knee. Loosening of the prosthetic joint. Knee pain that does not go away. What happens before the procedure? Staying hydrated Follow instructions from your health care provider about hydration, which may include: Up to 2 hours before the procedure - you may continue to drink clear liquids, such as water , clear fruit juice, black coffee, and plain tea.  Eating and drinking restrictions Follow instructions from your health care provider about eating and drinking, which may include:  8 hours before the procedure - stop eating heavy meals or foods, such as meat, fried foods, or fatty foods. 6 hours before the procedure - stop eating light meals or foods, such as toast or cereal. 6 hours before the procedure - stop drinking milk or drinks that contain milk. 2 hours before the procedure - stop drinking clear liquids. Medicines Ask your health care provider about: Changing or stopping your regular medicines. This is especially important if you are  taking diabetes medicines or blood thinners. Taking medicines such as aspirin  and ibuprofen. These medicines can thin your blood. Do not take these medicines unless your health care provider tells you to take them. Taking over-the-counter medicines, vitamins, herbs, and supplements. Tests and exams You may have: A physical exam. Tests, such as X-rays, MRI, CT scan, or bone scan. A blood or urine sample taken. Lifestyle  Keep your body and teeth clean. Germs from anywhere in your body can travel to your new joint and infect it. Tell your health care provider if you: Plan to have dental care and routine cleanings. Develop any skin infections. If your health care provider prescribes physical therapy, do exercises as instructed. If you are overweight, work with your health care provider to reach a safe weight. Extra weight can put pressure on your knee. Do not use any products that contain nicotine or tobacco for at least 4 weeks before the procedure. These products include cigarettes, chewing tobacco, and vaping devices, such as e-cigarettes. If you need help quitting, ask your health care provider. Surgery safety Ask your health care provider: How your surgery site will be marked. What steps will be taken to help prevent infection. These steps may include: Removing hair at the surgery site. Washing skin with a germ-killing soap. Taking antibiotic medicine. General instructions Do not shave your legs just before surgery. If hair removal is needed, it will be done in the hospital. Plan to have a responsible adult take you home from the hospital or clinic. Plan to have a responsible adult care for you for the time you are told after you leave the hospital or clinic. This is important. It is recommended that you have someone to help care for you for at least 4-6 weeks after your procedure. What happens during the procedure? An IV will be inserted into one of your veins. You will be given one  or more of the following: A medicine to help you relax (sedative). A medicine that is injected into an area of your body near the nerves to numb everything below the injection site (peripheral nerve block). A medicine that is injected into your spine to numb the area below and slightly above the injection site (spinal anesthetic). A medicine to make you fall asleep (general anesthetic). An incision will be made in your knee. Damaged cartilage and bone will be removed from your femur, tibia, and patella. Parts of the prosthesis (liners) will be placed over the areas of bone and cartilage that were removed. A metal liner will be placed over your femur, and plastic liners will be placed over your tibia and the underside of your patella. Your incision will be closed with stitches (sutures), staples, skin glue, or adhesive strips. A bandage (dressing) will be placed over your incision. The procedure may vary among health care providers and hospitals. What happens after the procedure? Your blood pressure, heart rate, breathing rate, and blood oxygen level will be monitored until you leave the hospital or clinic. You will be  given medicines to help manage pain. You may: Continue to receive fluids through an IV. Have to wear compression stockings. These stockings help to prevent blood clots and reduce swelling in your legs. You will be encouraged to move. A physical therapist will teach you how to use crutches or a walker and how to exercise at home. Do not drive until your health care provider approves. Summary Total knee replacement is a procedure to replace the knee joint with an artificial knee joint. Before the procedure, follow instructions from your health care provider about eating and drinking. Plan to have a responsible adult take you home from the hospital or clinic. This information is not intended to replace advice given to you by your health care provider. Make sure you discuss any  questions you have with your health care provider. Document Revised: 12/01/2019 Document Reviewed: 12/02/2019 Elsevier Patient Education  2024 [employer]  General Anesthesia, Adult General anesthesia is the use of medicine to make you fall asleep (unconscious) for a medical procedure. General anesthesia must be used for certain procedures. It is often recommended for surgery or procedures that: Last a long time. Require you to be still or in an unusual position. Are major and can cause blood loss. Affect your breathing. The medicines used for general anesthesia are called general anesthetics. During general anesthesia, these medicines are given along with medicines that: Prevent pain. Control your blood pressure. Relax your muscles. Prevent nausea and vomiting after the procedure. Tell a health care provider about: Any allergies you have. All medicines you are taking, including vitamins, herbs, eye drops, creams, and over-the-counter medicines. Your history of any: Medical conditions you have, including: High blood pressure. Bleeding problems. Diabetes. Heart or lung conditions, such as: Heart failure. Sleep apnea. Asthma. Chronic obstructive pulmonary disease (COPD). Current or recent illnesses, such as: Upper respiratory, chest, or ear infections. Cough or fever. Tobacco or drug use, including marijuana or alcohol use. Depression or anxiety. Surgeries and types of anesthetics you have had. Problems you or family members have had with anesthetic medicines. Whether you are pregnant or may be pregnant. Whether you have any chipped or loose teeth, dentures, caps, bridgework, or issues with your mouth, swallowing, or choking. What are the risks? Your health care provider will talk with you about risks. These may include: Allergic reaction to the medicines. Lung and heart problems. Inhaling food or liquid from the stomach into the lungs (aspiration). Nerve injury. Injury to  the lips, mouth, teeth, or gums. Stroke. Waking up during your procedure and being unable to move. This is rare. These problems are more likely to develop if you are having a major surgery or if you have an advanced or serious medical condition. You can prevent some of these complications by answering all of your health care provider's questions thoroughly and by following all instructions before your procedure. General anesthesia can cause side effects, including: Nausea or vomiting. A sore throat or hoarseness from the breathing tube. Wheezing or coughing. Shaking chills or feeling cold. Body aches. Sleepiness. Confusion, agitation (delirium), or anxiety. What happens before the procedure? When to stop eating and drinking Follow instructions from your health care provider about what you may eat and drink before your procedure. If you do not follow your health care provider's instructions, your procedure may be delayed or canceled. Medicines Ask your health care provider about: Changing or stopping your regular medicines. These include any diabetes medicines or blood thinners you take. Taking medicines such as aspirin   and ibuprofen. These medicines can thin your blood. Do not take them unless your health care provider tells you to. Taking over-the-counter medicines, vitamins, herbs, and supplements. General instructions Do not use any products that contain nicotine or tobacco for at least 4 weeks before the procedure. These products include cigarettes, chewing tobacco, and vaping devices, such as e-cigarettes. If you need help quitting, ask your health care provider. If you brush your teeth on the morning of the procedure, make sure to spit out all of the water  and toothpaste. If told by your health care provider, bring your sleep apnea device with you to surgery (if applicable). If you will be going home right after the procedure, plan to have a responsible adult: Take you home from the  hospital or clinic. You will not be allowed to drive. Care for you for the time you are told. What happens during the procedure?  An IV will be inserted into one of your veins. You will be given one or more of the following through a face mask or IV: A sedative. This helps you relax. Anesthesia. This will: Numb certain areas of your body. Make you fall asleep for surgery. After you are unconscious, a breathing tube may be inserted down your throat to help you breathe. This will be removed before you wake up. An anesthesia provider, such as an anesthesiologist, will stay with you throughout your procedure. The anesthesia provider will: Keep you comfortable and safe by continuing to give you medicines and adjusting the amount of medicine that you get. Monitor your blood pressure, heart rate, and oxygen levels to make sure that the anesthetics do not cause any problems. The procedure may vary among health care providers and hospitals. What happens after the procedure? Your blood pressure, temperature, heart rate, breathing rate, and blood oxygen level will be monitored until you leave the hospital or clinic. You will wake up in a recovery area. You may wake up slowly. You may be given medicine to help you with pain, nausea, or any other side effects from the anesthesia. Summary General anesthesia is the use of medicine to make you fall asleep (unconscious) for a medical procedure. Follow your health care provider's instructions about when to stop eating, drinking, or taking certain medicines before your procedure. Plan to have a responsible adult take you home from the hospital or clinic. This information is not intended to replace advice given to you by your health care provider. Make sure you discuss any questions you have with your health care provider. Document Revised: 09/08/2021 Document Reviewed: 09/08/2021 Elsevier Patient Education  2024 [employer]  How to Use Chlorhexidine   Before Surgery Chlorhexidine  gluconate (CHG) is a germ-killing (antiseptic) solution that is used to clean the skin. It can get rid of the bacteria that normally live on the skin and can keep them away for about 24 hours. To clean your skin with CHG, you may be given: A CHG solution to use in the shower or as part of a sponge bath. A prepackaged cloth that contains CHG. Cleaning your skin with CHG may help lower the risk for infection: While you are staying in the intensive care unit of the hospital. If you have a vascular access, such as a central line, to provide short-term or long-term access to your veins. If you have a catheter to drain urine from your bladder. If you are on a ventilator. A ventilator is a machine that helps you breathe by moving air in and out  of your lungs. After surgery. What are the risks? Risks of using CHG include: A skin reaction. Hearing loss, if CHG gets in your ears and you have a perforated eardrum. Eye injury, if CHG gets in your eyes and is not rinsed out. The CHG product catching fire. Make sure that you avoid smoking and flames after applying CHG to your skin. Do not use CHG: If you have a chlorhexidine  allergy or have previously reacted to chlorhexidine . On babies younger than 51 months of age. How to use CHG solution Use CHG only as told by your health care provider, and follow the instructions on the label. Use the full amount of CHG as directed. Usually, this is one bottle. During a shower Follow these steps when using CHG solution during a shower (unless your health care provider gives you different instructions): Start the shower. Use your normal soap and shampoo to wash your face and hair. Turn off the shower or move out of the shower stream. Pour the CHG onto a clean washcloth. Do not use any type of brush or rough-edged sponge. Starting at your neck, lather your body down to your toes. Make sure you follow these instructions: If you will be  having surgery, pay special attention to the part of your body where you will be having surgery. Scrub this area for at least 1 minute. Do not use CHG on your head or face. If the solution gets into your ears or eyes, rinse them well with water . Avoid your genital area. Avoid any areas of skin that have broken skin, cuts, or scrapes. Scrub your back and under your arms. Make sure to wash skin folds. Let the lather sit on your skin for 1-2 minutes or as long as told by your health care provider. Thoroughly rinse your entire body in the shower. Make sure that all body creases and crevices are rinsed well. Dry off with a clean towel. Do not put any substances on your body afterward--such as powder, lotion, or perfume--unless you are told to do so by your health care provider. Only use lotions that are recommended by the manufacturer. Put on clean clothes or pajamas. If it is the night before your surgery, sleep in clean sheets.  During a sponge bath Follow these steps when using CHG solution during a sponge bath (unless your health care provider gives you different instructions): Use your normal soap and shampoo to wash your face and hair. Pour the CHG onto a clean washcloth. Starting at your neck, lather your body down to your toes. Make sure you follow these instructions: If you will be having surgery, pay special attention to the part of your body where you will be having surgery. Scrub this area for at least 1 minute. Do not use CHG on your head or face. If the solution gets into your ears or eyes, rinse them well with water . Avoid your genital area. Avoid any areas of skin that have broken skin, cuts, or scrapes. Scrub your back and under your arms. Make sure to wash skin folds. Let the lather sit on your skin for 1-2 minutes or as long as told by your health care provider. Using a different clean, wet washcloth, thoroughly rinse your entire body. Make sure that all body creases and crevices  are rinsed well. Dry off with a clean towel. Do not put any substances on your body afterward--such as powder, lotion, or perfume--unless you are told to do so by your health care provider. Only  use lotions that are recommended by the manufacturer. Put on clean clothes or pajamas. If it is the night before your surgery, sleep in clean sheets. How to use CHG prepackaged cloths Only use CHG cloths as told by your health care provider, and follow the instructions on the label. Use the CHG cloth on clean, dry skin. Do not use the CHG cloth on your head or face unless your health care provider tells you to. When washing with the CHG cloth: Avoid your genital area. Avoid any areas of skin that have broken skin, cuts, or scrapes. Before surgery Follow these steps when using a CHG cloth to clean before surgery (unless your health care provider gives you different instructions): Using the CHG cloth, vigorously scrub the part of your body where you will be having surgery. Scrub using a back-and-forth motion for 3 minutes. The area on your body should be completely wet with CHG when you are done scrubbing. Do not rinse. Discard the cloth and let the area air-dry. Do not put any substances on the area afterward, such as powder, lotion, or perfume. Put on clean clothes or pajamas. If it is the night before your surgery, sleep in clean sheets.  For general bathing Follow these steps when using CHG cloths for general bathing (unless your health care provider gives you different instructions). Use a separate CHG cloth for each area of your body. Make sure you wash between any folds of skin and between your fingers and toes. Wash your body in the following order, switching to a new cloth after each step: The front of your neck, shoulders, and chest. Both of your arms, under your arms, and your hands. Your stomach and groin area, avoiding the genitals. Your right leg and foot. Your left leg and foot. The  back of your neck, your back, and your buttocks. Do not rinse. Discard the cloth and let the area air-dry. Do not put any substances on your body afterward--such as powder, lotion, or perfume--unless you are told to do so by your health care provider. Only use lotions that are recommended by the manufacturer. Put on clean clothes or pajamas. Contact a health care provider if: Your skin gets irritated after scrubbing. You have questions about using your solution or cloth. You swallow any chlorhexidine . Call your local poison control center ([phone] in the [place] Get help right away if: Your eyes itch badly, or they become very red or swollen. Your skin itches badly and is red or swollen. Your hearing changes. You have trouble seeing. You have swelling or tingling in your mouth or throat. You have trouble breathing. These symptoms may represent a serious problem that is an emergency. Do not wait to see if the symptoms will go away. Get medical help right away. Call your local emergency services (911 in the [place] Do not drive yourself to the hospital. Summary Chlorhexidine  gluconate (CHG) is a germ-killing (antiseptic) solution that is used to clean the skin. Cleaning your skin with CHG may help to lower your risk for infection. You may be given CHG to use for bathing. It may be in a bottle or in a prepackaged cloth to use on your skin. Carefully follow your health care provider's instructions and the instructions on the product label. Do not use CHG if you have a chlorhexidine  allergy. Contact your health care provider if your skin gets irritated after scrubbing. This information is not intended to replace advice given to you by your health care provider. Make  sure you discuss any questions you have with your health care provider. Document Revised: 10/10/2021 Document Reviewed: 08/23/2020 Elsevier Patient Education  2023 [employer].

## 2023-06-28 NOTE — Telephone Encounter (Signed)
 Patient called and stated that she is having surgery on the 7th, but has not been scheduled for pre-op.  She is asking for a return call at [phone]

## 2023-06-28 NOTE — Telephone Encounter (Signed)
 I called her gave her the number for pre op

## 2023-06-29 NOTE — H&P (Signed)
 TOTAL KNEE ADMISSION H&P  Patient is being admitted for left total knee arthroplasty.  Subjective:  Chief Complaint:left knee pain.  HPI: Stacy [name], 68 y.o. female, has a history of pain and functional disability in the left knee due to arthritis and has failed non-surgical conservative treatments for greater than 12 weeks to includeNSAID's and/or analgesics, corticosteriod injections, use of assistive devices, weight reduction as appropriate, and activity modification.  Onset of symptoms was gradual, starting 5 -7 years ago with gradually worsening course since that time. Patient currently rates pain in the left knee(s) at 8 out of 10 with activity. Patient has night pain, worsening of pain with activity and weight bearing, pain that interferes with activities of daily living, pain with passive range of motion, crepitus, and joint swelling.  Patient has evidence of subchondral cysts, subchondral sclerosis, periarticular osteophytes, joint subluxation, and joint space narrowing by imaging studies. There is no active infection.  Patient Active Problem List   Diagnosis Date Noted   S/P TKR (total knee replacement), right 09/26/22 10/11/2022   Osteoarthritis of right knee 09/26/2022   Morbid obesity (HCC) 08/16/2021   Hyperlipidemia 08/16/2021   Edema of both lower extremities 08/16/2021   At risk for falls 08/16/2021   Anemia, unspecified 03/23/2020   Impaired fasting glucose 03/23/2020   Arthralgia of lower leg 11/04/2019   Morbid obesity with BMI of 45.0-49.9, adult (HCC) 03/13/2018   AKI (acute kidney injury) (HCC) 03/09/2018   Bile leak, postoperative 03/09/2018   Essential hypertension 03/09/2018   S/P laparoscopic cholecystectomy 03/09/2018   Past Medical History:  Diagnosis Date   Arthritis    Chronic kidney disease    Hypertension     Past Surgical History:  Procedure Laterality Date   CHOLECYSTECTOMY     TOTAL KNEE ARTHROPLASTY Right 09/26/2022   Procedure: RIGHT TOTAL  KNEE ARTHROPLASTY;  Surgeon: Margrette Taft BRAVO, MD;  Location: [facility];  Service: Orthopedics;  Laterality: Right;   TUBAL LIGATION      Current Facility-Administered Medications  Medication Dose Route Frequency Provider Last Rate Last Admin   bupivacaine -meloxicam  ER (ZYNRELEF ) injection 400 mg  400 mg Infiltration Once Aliah Eriksson E, MD       Current Outpatient Medications  Medication Sig Dispense Refill Last Dose/Taking   acetaminophen -codeine  (TYLENOL  #3) 300-30 MG tablet Take 1 tablet by mouth every 6 (six) hours as needed for moderate pain (pain score 4-6). 30 tablet 0 Taking As Needed   aspirin -acetaminophen -caffeine (EXCEDRIN MIGRAINE) 250-250-65 MG tablet Take 1.5 tablets by mouth every 8 (eight) hours as needed for headache or migraine.   Taking As Needed   atorvastatin  (LIPITOR) 20 MG tablet Take 20 mg by mouth in the morning.   Taking   diclofenac  (VOLTAREN ) 75 MG EC tablet TAKE 1 TABLET BY MOUTH TWICE DAILY AS NEEDED FOR PAIN (moderate) 60 tablet 1 Taking   ferrous sulfate  325 (65 FE) MG tablet Take 325 mg by mouth in the morning.   Taking   hydrochlorothiazide  (HYDRODIURIL ) 25 MG tablet Take 25 mg by mouth in the morning.   Taking   losartan  (COZAAR ) 50 MG tablet Take 50 mg by mouth in the morning.   Taking   Phenylephrine -APAP-guaiFENesin  (TYLENOL  SINUS SEVERE) 5-325-200 MG TABS Take 2 tablets by mouth daily as needed (sinus congestion).   Taking As Needed   traMADol  (ULTRAM ) 50 MG tablet Take 50 mg by mouth 3 (three) times daily as needed (pain.).   Taking As Needed   Vitamin D , Ergocalciferol , (DRISDOL ) 1.25  MG (50000 UNIT) CAPS capsule Take 50,000 Units by mouth every 14 (fourteen) days.   Taking   No Known Allergies  Social History   Tobacco Use   Smoking status: Never   Smokeless tobacco: Never  Substance Use Topics   Alcohol use: Not Currently    Family History  Problem Relation Age of Onset   COPD Mother    Arthritis Mother    Heart attack Mother     Cancer Father    Hypertension Father    Arthritis Sister    Hypertension Sister    Arthritis Brother    Hypertension Brother      Review of Systems  Constitutional:  Negative for fever and unexpected weight change.  Respiratory:  Negative for shortness of breath.   Cardiovascular:  Negative for chest pain.  Gastrointestinal: Negative.   Genitourinary: Negative.  Negative for difficulty urinating.  Neurological:  Negative for numbness.  All other systems reviewed and are negative.   Objective:  Physical Exam Vitals and nursing note reviewed.  Constitutional:      General: She is not in acute distress.    Appearance: Normal appearance. She is normal weight. She is not ill-appearing, toxic-appearing or diaphoretic.  HENT:     Head: Normocephalic and atraumatic.     Right Ear: External ear normal.     Left Ear: External ear normal.     Nose: Nose normal. No congestion or rhinorrhea.     Mouth/Throat:     Mouth: Mucous membranes are moist.     Pharynx: No oropharyngeal exudate or posterior oropharyngeal erythema.  Eyes:     General: No scleral icterus.       Right eye: No discharge.        Left eye: No discharge.     Extraocular Movements: Extraocular movements intact.     Conjunctiva/sclera: Conjunctivae normal.     Pupils: Pupils are equal, round, and reactive to light.  Cardiovascular:     Rate and Rhythm: Normal rate and regular rhythm.     Heart sounds: Normal heart sounds.  Pulmonary:     Effort: Pulmonary effort is normal. No respiratory distress.     Breath sounds: Normal breath sounds. No stridor.  Chest:     Chest wall: No tenderness.  Abdominal:     General: Abdomen is flat. Bowel sounds are normal. There is no distension.     Palpations: Abdomen is soft. There is no mass.     Tenderness: There is no abdominal tenderness.  Musculoskeletal:     Cervical back: Normal range of motion and neck supple. No rigidity or tenderness.  Lymphadenopathy:     Cervical:  No cervical adenopathy.  Skin:    General: Skin is warm and dry.     Capillary Refill: Capillary refill takes less than 2 seconds.  Neurological:     General: No focal deficit present.     Mental Status: She is alert and oriented to person, place, and time.     Cranial Nerves: No cranial nerve deficit.     Sensory: No sensory deficit.     Motor: No weakness.     Gait: Gait abnormal.     Deep Tendon Reflexes: Reflexes normal.  Psychiatric:        Mood and Affect: Mood normal.        Behavior: Behavior normal.        Thought Content: Thought content normal.        Judgment: Judgment normal.  Upper extremities are normal  On the right side we have a normal functioning right total knee  On the left side we have a varus knee in terms of alignment with normal skin envelope.  We also see that she has some tenderness along the medial joint line with crepitance on range of motion.  She does have a flexion contracture.  Her flexion arc is 10-125 for total arc of motion of 115 degrees  No evidence of pseudolaxity or laxity of the ligaments.  Quadriceps function and extensor mechanisms intact    Vital signs in last 24 hours: Temp:  [97.7 F (36.5 C)] 97.7 F (36.5 C) (01/02 1527) Pulse Rate:  [64] 64 (01/02 1527) Resp:  [18] 18 (01/02 1527) BP: (149)/(75) 149/75 (01/02 1527) SpO2:  [100 %] 100 % (01/02 1527) Weight:  [100.2 kg] 100.2 kg (01/02 1527)  Labs:     Latest Ref Rng & Units 06/28/2023    3:28 PM 09/28/2022    5:16 AM 09/27/2022    4:08 AM  CBC  WBC 4.0 - 10.5 K/uL 8.8  15.1  14.4   Hemoglobin 12.0 - 15.0 g/dL 87.0  88.3  88.0   Hematocrit 36.0 - 46.0 % 41.7  37.4  40.9   Platelets 150 - 400 K/uL 317  250  220       Latest Ref Rng & Units 06/28/2023    3:28 PM 09/27/2022    4:08 AM 09/18/2022    3:05 PM  BMP  Glucose 70 - 99 mg/dL 85  69  87   BUN 8 - 23 mg/dL 30  34  38   Creatinine 0.44 - 1.00 mg/dL 8.82  9.06  8.43   Sodium 135 - 145 mmol/L 141  138  140    Potassium 3.5 - 5.1 mmol/L 3.4  3.8  3.8   Chloride 98 - 111 mmol/L 106  109  109   CO2 22 - 32 mmol/L 24  19  22    Calcium  8.9 - 10.3 mg/dL 9.6  8.1  8.9        Estimated body mass index is 39.15 kg/m as calculated from the following:   Height as of 06/28/23: 5' 3 (1.6 m).   Weight as of 06/28/23: 100.2 kg.   Imaging Review Plain radiographs demonstrate severe degenerative joint disease of the left knee(s). The overall alignment issignificant varus. The bone quality appears to be good for age and reported activity level.      Assessment/Plan:  End stage arthritis, left knee   The patient history, physical examination, clinical judgment of the provider and imaging studies are consistent with end stage degenerative joint disease of the left knee(s) and total knee arthroplasty is deemed medically necessary. The treatment options including medical management, injection therapy arthroscopy and arthroplasty were discussed at length. The risks and benefits of total knee arthroplasty were presented and reviewed. The risks due to aseptic loosening, infection, stiffness, patella tracking problems, thromboembolic complications and other imponderables were discussed. The patient acknowledged the explanation, agreed to proceed with the plan and consent was signed. Patient is being admitted for inpatient treatment for surgery, pain control, PT, OT, prophylactic antibiotics, VTE prophylaxis, progressive ambulation and ADL's and discharge planning. The patient is planning to be discharged home with home health services     Patient's anticipated LOS is less than 2 midnights.

## 2023-07-03 ENCOUNTER — Ambulatory Visit (HOSPITAL_COMMUNITY): Payer: Medicare PPO

## 2023-07-03 ENCOUNTER — Ambulatory Visit (HOSPITAL_COMMUNITY): Payer: Medicare PPO | Admitting: Anesthesiology

## 2023-07-03 ENCOUNTER — Other Ambulatory Visit: Payer: Self-pay

## 2023-07-03 ENCOUNTER — Observation Stay (HOSPITAL_COMMUNITY)
Admission: RE | Admit: 2023-07-03 | Discharge: 2023-07-04 | Disposition: A | Discharge status: Home / Self Care | Payer: Medicare PPO | Source: Ambulatory Visit | Attending: Orthopedic Surgery | Admitting: Orthopedic Surgery

## 2023-07-03 ENCOUNTER — Encounter (HOSPITAL_COMMUNITY)
Admission: RE | Disposition: A | Discharge status: Home / Self Care | Payer: Self-pay | Source: Ambulatory Visit | Attending: Orthopedic Surgery

## 2023-07-03 ENCOUNTER — Encounter (HOSPITAL_COMMUNITY): Payer: Self-pay | Admitting: Orthopedic Surgery

## 2023-07-03 DIAGNOSIS — N189 Chronic kidney disease, unspecified: Secondary | ICD-10-CM

## 2023-07-03 DIAGNOSIS — M1712 Unilateral primary osteoarthritis, left knee: Principal | ICD-10-CM

## 2023-07-03 DIAGNOSIS — Z96651 Presence of right artificial knee joint: Secondary | ICD-10-CM | POA: Diagnosis not present

## 2023-07-03 DIAGNOSIS — E785 Hyperlipidemia, unspecified: Secondary | ICD-10-CM | POA: Diagnosis not present

## 2023-07-03 DIAGNOSIS — Z7982 Long term (current) use of aspirin: Secondary | ICD-10-CM | POA: Diagnosis not present

## 2023-07-03 DIAGNOSIS — I129 Hypertensive chronic kidney disease with stage 1 through stage 4 chronic kidney disease, or unspecified chronic kidney disease: Secondary | ICD-10-CM

## 2023-07-03 DIAGNOSIS — Z79899 Other long term (current) drug therapy: Secondary | ICD-10-CM | POA: Insufficient documentation

## 2023-07-03 HISTORY — PX: TOTAL KNEE ARTHROPLASTY: SHX125

## 2023-07-03 SURGERY — ARTHROPLASTY, KNEE, TOTAL
Anesthesia: Spinal | Site: Knee | Laterality: Left

## 2023-07-03 MED ORDER — TRANEXAMIC ACID-NACL 1000-0.7 MG/100ML-% IV SOLN
INTRAVENOUS | Status: AC
  Administered 2023-07-03: 1000 mg via INTRAVENOUS
  Filled 2023-07-03: qty 100

## 2023-07-03 MED ORDER — PROPOFOL 500 MG/50ML IV EMUL
INTRAVENOUS | Status: DC | PRN
  Administered 2023-07-03: 80 ug/kg/min via INTRAVENOUS

## 2023-07-03 MED ORDER — PHENYLEPHRINE HCL (PRESSORS) 10 MG/ML IV SOLN
INTRAVENOUS | Status: DC | PRN
  Administered 2023-07-03 (×2): 80 ug via INTRAVENOUS
  Administered 2023-07-03: 160 ug via INTRAVENOUS
  Administered 2023-07-03 (×2): 80 ug via INTRAVENOUS
  Administered 2023-07-03: 160 ug via INTRAVENOUS
  Administered 2023-07-03 (×2): 80 ug via INTRAVENOUS

## 2023-07-03 MED ORDER — FENTANYL CITRATE PF 50 MCG/ML IJ SOSY: 25.0000 ug | PREFILLED_SYRINGE | INTRAMUSCULAR | Status: DC | PRN

## 2023-07-03 MED ORDER — STERILE WATER FOR IRRIGATION IR SOLN
Status: DC | PRN
  Administered 2023-07-03: 1000 mL

## 2023-07-03 MED ORDER — PROPOFOL 10 MG/ML IV BOLUS
INTRAVENOUS | Status: AC
Start: 2023-07-03 — End: ?
  Filled 2023-07-03: qty 40

## 2023-07-03 MED ORDER — CHLORHEXIDINE GLUCONATE 0.12 % MT SOLN
15.0000 mL | Freq: Once | OROMUCOSAL | Status: AC
  Administered 2023-07-03: 15 mL via OROMUCOSAL
  Filled 2023-07-03: qty 15

## 2023-07-03 MED ORDER — FERROUS SULFATE 325 (65 FE) MG PO TABS
325.0000 mg | ORAL_TABLET | Freq: Every morning | ORAL | Status: DC
  Administered 2023-07-03 – 2023-07-04 (×2): 325 mg via ORAL
  Filled 2023-07-03 (×2): qty 1

## 2023-07-03 MED ORDER — MIDAZOLAM HCL 2 MG/2ML IJ SOLN
INTRAMUSCULAR | Status: AC
Start: 2023-07-03 — End: ?
  Filled 2023-07-03: qty 2

## 2023-07-03 MED ORDER — PREGABALIN 50 MG PO CAPS
50.0000 mg | ORAL_CAPSULE | Freq: Once | ORAL | Status: AC
  Administered 2023-07-03: 50 mg via ORAL
  Filled 2023-07-03: qty 1

## 2023-07-03 MED ORDER — ROPIVACAINE HCL 5 MG/ML IJ SOLN
INTRAMUSCULAR | Status: AC
  Filled 2023-07-03: qty 30

## 2023-07-03 MED ORDER — ASPIRIN 81 MG PO CHEW
81.0000 mg | CHEWABLE_TABLET | Freq: Two times a day (BID) | ORAL | Status: DC
  Administered 2023-07-03 – 2023-07-04 (×2): 81 mg via ORAL
  Filled 2023-07-03 (×2): qty 1

## 2023-07-03 MED ORDER — ONDANSETRON HCL 4 MG/2ML IJ SOLN: 4.0000 mg | Freq: Once | INTRAMUSCULAR | Status: DC | PRN

## 2023-07-03 MED ORDER — BUPIVACAINE-MELOXICAM ER 200-6 MG/7ML IJ SOLN
INTRAMUSCULAR | Status: DC | PRN
  Administered 2023-07-03: 400 mg

## 2023-07-03 MED ORDER — PROPOFOL 500 MG/50ML IV EMUL
INTRAVENOUS | Status: AC
  Filled 2023-07-03: qty 150

## 2023-07-03 MED ORDER — HYDROCHLOROTHIAZIDE 25 MG PO TABS
25.0000 mg | ORAL_TABLET | Freq: Every morning | ORAL | Status: DC
  Administered 2023-07-03 – 2023-07-04 (×2): 25 mg via ORAL
  Filled 2023-07-03 (×2): qty 1

## 2023-07-03 MED ORDER — MENTHOL 3 MG MT LOZG: 1.0000 | LOZENGE | OROMUCOSAL | Status: DC | PRN

## 2023-07-03 MED ORDER — OXYCODONE HCL 5 MG PO TABS: 5.0000 mg | ORAL_TABLET | Freq: Once | ORAL | Status: DC | PRN

## 2023-07-03 MED ORDER — CEFAZOLIN SODIUM-DEXTROSE 2-4 GM/100ML-% IV SOLN
2.0000 g | INTRAVENOUS | Status: AC
  Administered 2023-07-03: 2 g via INTRAVENOUS
  Filled 2023-07-03: qty 100

## 2023-07-03 MED ORDER — MIDAZOLAM HCL 5 MG/5ML IJ SOLN
INTRAMUSCULAR | Status: DC | PRN
  Administered 2023-07-03: 2 mg via INTRAVENOUS

## 2023-07-03 MED ORDER — METOCLOPRAMIDE HCL 10 MG PO TABS: 5.0000 mg | ORAL_TABLET | Freq: Three times a day (TID) | ORAL | Status: DC | PRN

## 2023-07-03 MED ORDER — CEFAZOLIN SODIUM-DEXTROSE 2-4 GM/100ML-% IV SOLN
2.0000 g | Freq: Four times a day (QID) | INTRAVENOUS | Status: AC
  Administered 2023-07-03 (×2): 2 g via INTRAVENOUS
  Filled 2023-07-03 (×2): qty 100

## 2023-07-03 MED ORDER — DEXMEDETOMIDINE HCL IN NACL 80 MCG/20ML IV SOLN
INTRAVENOUS | Status: DC | PRN
  Administered 2023-07-03 (×2): 20 ug via INTRAVENOUS

## 2023-07-03 MED ORDER — ALUM & MAG HYDROXIDE-SIMETH 200-200-20 MG/5ML PO SUSP: 30.0000 mL | ORAL | Status: DC | PRN

## 2023-07-03 MED ORDER — POLYETHYLENE GLYCOL 3350 17 G PO PACK: 17.0000 g | PACK | Freq: Every day | ORAL | Status: DC | PRN

## 2023-07-03 MED ORDER — CELECOXIB 100 MG PO CAPS
200.0000 mg | ORAL_CAPSULE | Freq: Two times a day (BID) | ORAL | Status: DC
Start: 2023-07-03 — End: 2023-07-04
  Administered 2023-07-03 – 2023-07-04 (×3): 200 mg via ORAL
  Filled 2023-07-03 (×3): qty 2

## 2023-07-03 MED ORDER — BUPIVACAINE-MELOXICAM ER 200-6 MG/7ML IJ SOLN
INTRAMUSCULAR | Status: AC
Start: 2023-07-03 — End: ?
  Filled 2023-07-03: qty 2

## 2023-07-03 MED ORDER — ORAL CARE MOUTH RINSE: 15.0000 mL | Freq: Once | OROMUCOSAL | Status: AC

## 2023-07-03 MED ORDER — ONDANSETRON HCL 4 MG/2ML IJ SOLN
INTRAMUSCULAR | Status: AC
  Filled 2023-07-03: qty 2

## 2023-07-03 MED ORDER — PHENYLEPHRINE-APAP-GUAIFENESIN 5-325-200 MG PO TABS: 2.0000 | ORAL_TABLET | Freq: Every day | ORAL | Status: DC | PRN

## 2023-07-03 MED ORDER — DEXTROSE-SODIUM CHLORIDE 5-0.9 % IV SOLN: INTRAVENOUS | Status: DC

## 2023-07-03 MED ORDER — EPHEDRINE 5 MG/ML INJ
INTRAVENOUS | Status: AC
  Filled 2023-07-03: qty 5

## 2023-07-03 MED ORDER — DIPHENHYDRAMINE HCL 12.5 MG/5ML PO ELIX
12.5000 mg | ORAL_SOLUTION | ORAL | Status: DC | PRN
Start: 2023-07-03 — End: 2023-07-04

## 2023-07-03 MED ORDER — PHENYLEPHRINE 80 MCG/ML (10ML) SYRINGE FOR IV PUSH (FOR BLOOD PRESSURE SUPPORT)
PREFILLED_SYRINGE | INTRAVENOUS | Status: AC
  Filled 2023-07-03: qty 10

## 2023-07-03 MED ORDER — DEXAMETHASONE SODIUM PHOSPHATE 10 MG/ML IJ SOLN
INTRAMUSCULAR | Status: AC
  Filled 2023-07-03: qty 1

## 2023-07-03 MED ORDER — VITAMIN D (ERGOCALCIFEROL) 1.25 MG (50000 UNIT) PO CAPS: 50000.0000 [IU] | ORAL_CAPSULE | ORAL | Status: DC

## 2023-07-03 MED ORDER — CELECOXIB 400 MG PO CAPS
400.0000 mg | ORAL_CAPSULE | Freq: Once | ORAL | Status: AC
  Administered 2023-07-03: 400 mg via ORAL
  Filled 2023-07-03: qty 1

## 2023-07-03 MED ORDER — METHOCARBAMOL 500 MG PO TABS: 500.0000 mg | ORAL_TABLET | Freq: Four times a day (QID) | ORAL | Status: DC | PRN

## 2023-07-03 MED ORDER — HYDROMORPHONE HCL 2 MG PO TABS
1.0000 mg | ORAL_TABLET | ORAL | Status: DC | PRN
  Administered 2023-07-03 – 2023-07-04 (×4): 2 mg via ORAL
  Filled 2023-07-03 (×4): qty 1

## 2023-07-03 MED ORDER — EPHEDRINE SULFATE-NACL 50-0.9 MG/10ML-% IV SOSY
PREFILLED_SYRINGE | INTRAVENOUS | Status: DC | PRN
  Administered 2023-07-03: 10 mg via INTRAVENOUS
  Administered 2023-07-03 (×2): 5 mg via INTRAVENOUS

## 2023-07-03 MED ORDER — TRANEXAMIC ACID-NACL 1000-0.7 MG/100ML-% IV SOLN
1000.0000 mg | Freq: Once | INTRAVENOUS | Status: AC
  Filled 2023-07-03: qty 100

## 2023-07-03 MED ORDER — POVIDONE-IODINE 10 % EX SWAB: 2.0000 | Freq: Once | CUTANEOUS | Status: DC

## 2023-07-03 MED ORDER — ACETAMINOPHEN 325 MG PO TABS
325.0000 mg | ORAL_TABLET | Freq: Four times a day (QID) | ORAL | Status: DC | PRN
  Administered 2023-07-03: 500 mg via ORAL

## 2023-07-03 MED ORDER — SODIUM CHLORIDE 0.9 % IR SOLN
Status: DC | PRN
  Administered 2023-07-03: 3000 mL

## 2023-07-03 MED ORDER — OXYCODONE HCL 5 MG/5ML PO SOLN: 5.0000 mg | Freq: Once | ORAL | Status: DC | PRN

## 2023-07-03 MED ORDER — 0.9 % SODIUM CHLORIDE (POUR BTL) OPTIME
TOPICAL | Status: DC | PRN
  Administered 2023-07-03: 1000 mL

## 2023-07-03 MED ORDER — DEXAMETHASONE SODIUM PHOSPHATE 10 MG/ML IJ SOLN
10.0000 mg | Freq: Once | INTRAMUSCULAR | Status: AC
  Administered 2023-07-04: 10 mg via INTRAVENOUS
  Filled 2023-07-03: qty 1

## 2023-07-03 MED ORDER — METOCLOPRAMIDE HCL 5 MG/ML IJ SOLN: 5.0000 mg | Freq: Three times a day (TID) | INTRAMUSCULAR | Status: DC | PRN

## 2023-07-03 MED ORDER — METHOCARBAMOL 1000 MG/10ML IJ SOLN: 500.0000 mg | Freq: Four times a day (QID) | INTRAMUSCULAR | Status: DC | PRN

## 2023-07-03 MED ORDER — ATORVASTATIN CALCIUM 20 MG PO TABS
20.0000 mg | ORAL_TABLET | Freq: Every day | ORAL | Status: DC
  Administered 2023-07-03 – 2023-07-04 (×2): 20 mg via ORAL
  Filled 2023-07-03 (×2): qty 1

## 2023-07-03 MED ORDER — DOCUSATE SODIUM 100 MG PO CAPS
100.0000 mg | ORAL_CAPSULE | Freq: Two times a day (BID) | ORAL | Status: DC
  Administered 2023-07-03 – 2023-07-04 (×3): 100 mg via ORAL
  Filled 2023-07-03 (×3): qty 1

## 2023-07-03 MED ORDER — LOSARTAN POTASSIUM 50 MG PO TABS
50.0000 mg | ORAL_TABLET | Freq: Every morning | ORAL | Status: DC
  Administered 2023-07-03 – 2023-07-04 (×2): 50 mg via ORAL
  Filled 2023-07-03 (×2): qty 1

## 2023-07-03 MED ORDER — METHOCARBAMOL 1000 MG/10ML IJ SOLN
500.0000 mg | Freq: Once | INTRAMUSCULAR | Status: AC
  Administered 2023-07-03: 500 mg via INTRAVENOUS
  Filled 2023-07-03: qty 5

## 2023-07-03 MED ORDER — ACETAMINOPHEN 500 MG PO TABS
1000.0000 mg | ORAL_TABLET | Freq: Four times a day (QID) | ORAL | Status: AC
  Administered 2023-07-03 – 2023-07-04 (×4): 1000 mg via ORAL
  Filled 2023-07-03 (×4): qty 2

## 2023-07-03 MED ORDER — TRANEXAMIC ACID-NACL 1000-0.7 MG/100ML-% IV SOLN
1000.0000 mg | INTRAVENOUS | Status: AC
  Administered 2023-07-03: 1000 mg via INTRAVENOUS
  Filled 2023-07-03: qty 100

## 2023-07-03 MED ORDER — PROPOFOL 10 MG/ML IV BOLUS
INTRAVENOUS | Status: AC
  Filled 2023-07-03: qty 40

## 2023-07-03 MED ORDER — LIDOCAINE HCL (PF) 2 % IJ SOLN
INTRAMUSCULAR | Status: AC
  Filled 2023-07-03: qty 10

## 2023-07-03 MED ORDER — LACTATED RINGERS IV SOLN: INTRAVENOUS | Status: DC

## 2023-07-03 MED ORDER — PHENOL 1.4 % MT LIQD: 1.0000 | OROMUCOSAL | Status: DC | PRN

## 2023-07-03 MED ORDER — DEXAMETHASONE SODIUM PHOSPHATE 10 MG/ML IJ SOLN
INTRAMUSCULAR | Status: DC | PRN
  Administered 2023-07-03: 10 mg via INTRAVENOUS

## 2023-07-03 MED ORDER — OXYCODONE HCL 5 MG PO TABS
5.0000 mg | ORAL_TABLET | Freq: Once | ORAL | Status: AC
  Administered 2023-07-03: 5 mg via ORAL
  Filled 2023-07-03: qty 1

## 2023-07-03 SURGICAL SUPPLY — 58 items
ATTUNE MED DOME PAT 32 KNEE (Knees) IMPLANT
ATTUNE PS FEM LT SZ 5 CEM KNEE (Femur) IMPLANT
BANDAGE ESMARK 6X9 LF (GAUZE/BANDAGES/DRESSINGS) ×1 IMPLANT
BASEPLATE TIB CMT FB PCKT SZ4 (Stem) IMPLANT
BLADE SAGITTAL 25.0X1.27X90 (BLADE) ×1 IMPLANT
BLADE SAW SGTL 11.0X1.19X90.0M (BLADE) ×1 IMPLANT
BLADE SURG SZ10 CARB STEEL (BLADE) ×1 IMPLANT
BNDG ELASTIC 4X5.8 VLCR NS LF (GAUZE/BANDAGES/DRESSINGS) ×2 IMPLANT
BNDG ELASTIC 6X5.8 VLCR NS LF (GAUZE/BANDAGES/DRESSINGS) ×1 IMPLANT
BNDG ESMARK 6X9 LF (GAUZE/BANDAGES/DRESSINGS) ×1
CEMENT HV SMART SET (Cement) ×2 IMPLANT
CLOTH BEACON ORANGE TIMEOUT ST (SAFETY) ×1 IMPLANT
COOLER ICEMAN CLASSIC (MISCELLANEOUS) ×1 IMPLANT
COUNTER NDL MAGNETIC 40 RED (SET/KITS/TRAYS/PACK) ×1 IMPLANT
COUNTER NEEDLE MAGNETIC 40 RED (SET/KITS/TRAYS/PACK) ×1
COVER LIGHT HANDLE STERIS (MISCELLANEOUS) ×2 IMPLANT
COVER TABLE BACK 60X90 (DRAPES) IMPLANT
CUFF TRNQT CYL 34X4.125X (TOURNIQUET CUFF) ×1 IMPLANT
DRAPE BACK TABLE (DRAPES) ×1 IMPLANT
DRAPE EXTREMITY T 121X128X90 (DISPOSABLE) ×1 IMPLANT
DRESSING AQUACEL AG ADV 3.5X12 (MISCELLANEOUS) ×1 IMPLANT
DRSG AQUACEL AG ADV 3.5X12 (MISCELLANEOUS) ×1
DURAPREP 26ML APPLICATOR (WOUND CARE) ×2 IMPLANT
ELECT REM PT RETURN 9FT ADLT (ELECTROSURGICAL) ×1
ELECTRODE REM PT RTRN 9FT ADLT (ELECTROSURGICAL) ×1 IMPLANT
GLOVE BIOGEL PI IND STRL 7.0 (GLOVE) ×6 IMPLANT
GLOVE BIOGEL PI IND STRL 8.5 (GLOVE) ×1 IMPLANT
GLOVE SKINSENSE STRL SZ8.0 LF (GLOVE) ×1 IMPLANT
GOWN STRL REUS W/TWL LRG LVL3 (GOWN DISPOSABLE) ×3 IMPLANT
GOWN STRL REUS W/TWL XL LVL3 (GOWN DISPOSABLE) ×1 IMPLANT
HOOD PEEL AWAY T7 (MISCELLANEOUS) ×4 IMPLANT
INSERT TIB FIX BEARNG SZ 5 5MM (Insert) IMPLANT
INST SET MAJOR BONE (KITS) ×1 IMPLANT
IV NS IRRIG 3000ML ARTHROMATIC (IV SOLUTION) ×1 IMPLANT
KIT TURNOVER KIT A (KITS) ×1 IMPLANT
MANIFOLD NEPTUNE II (INSTRUMENTS) ×1 IMPLANT
MARKER SKIN DUAL TIP RULER LAB (MISCELLANEOUS) ×1 IMPLANT
NS IRRIG 1000ML POUR BTL (IV SOLUTION) ×1 IMPLANT
PACK TOTAL JOINT (CUSTOM PROCEDURE TRAY) ×1 IMPLANT
PAD ARMBOARD 7.5X6 YLW CONV (MISCELLANEOUS) ×1 IMPLANT
PAD COLD SHLDR SM WRAP-ON (PAD) ×1 IMPLANT
PILLOW KNEE EXTENSION 0 DEG (MISCELLANEOUS) ×1 IMPLANT
PIN/DRILL PACK ORTHO 1/8X3.0 (PIN) ×1 IMPLANT
POSITIONER HEAD 8X9X4 ADT (SOFTGOODS) ×1 IMPLANT
SAW OSC TIP CART 19.5X105X1.3 (SAW) ×1 IMPLANT
SET BASIN LINEN APH (SET/KITS/TRAYS/PACK) ×1 IMPLANT
SET HNDPC FAN SPRY TIP SCT (DISPOSABLE) ×1 IMPLANT
SOLUTION IRRIG SURGIPHOR (IV SOLUTION) ×1 IMPLANT
STAPLER SKIN PROX WIDE 3.9 (STAPLE) ×1 IMPLANT
SUT BRALON NAB BRD #1 30IN (SUTURE) ×1 IMPLANT
SUT MNCRL 0 VIOLET CTX 36 (SUTURE) ×1 IMPLANT
SUT MON AB 0 CT1 (SUTURE) ×1 IMPLANT
SYR BULB IRRIG 60ML STRL (SYRINGE) ×1 IMPLANT
TOWEL OR 17X26 4PK STRL BLUE (TOWEL DISPOSABLE) ×1 IMPLANT
TOWER CARTRIDGE SMART MIX (DISPOSABLE) ×1 IMPLANT
TRAY FOLEY MTR SLVR 16FR STAT (SET/KITS/TRAYS/PACK) ×1 IMPLANT
WATER STERILE IRR 1000ML POUR (IV SOLUTION) ×2 IMPLANT
YANKAUER SUCT 12FT TUBE ARGYLE (SUCTIONS) ×1 IMPLANT

## 2023-07-03 NOTE — Anesthesia Preprocedure Evaluation (Signed)
 Anesthesia Evaluation  Patient identified by MRN, date of birth, ID band Patient awake    Reviewed: Allergy & Precautions, H&P , NPO status , Patient's Chart, lab work & pertinent test results, reviewed documented beta blocker date and time   Airway Mallampati: II  TM Distance: >3 FB Neck ROM: full    Dental no notable dental hx.    Pulmonary neg pulmonary ROS   Pulmonary exam normal breath sounds clear to auscultation       Cardiovascular Exercise Tolerance: Good hypertension, negative cardio ROS  Rhythm:regular Rate:Normal     Neuro/Psych negative neurological ROS  negative psych ROS   GI/Hepatic negative GI ROS, Neg liver ROS,,,  Endo/Other    Class 3 obesity  Renal/GU Renal diseasenegative Renal ROS  negative genitourinary   Musculoskeletal   Abdominal   Peds  Hematology negative hematology ROS (+) Blood dyscrasia, anemia   Anesthesia Other Findings   Reproductive/Obstetrics negative OB ROS                              Anesthesia Physical Anesthesia Plan  ASA: 3  Anesthesia Plan: Spinal   Post-op Pain Management: Regional block*   Induction:   PONV Risk Score and Plan:   Airway Management Planned:   Additional Equipment:   Intra-op Plan:   Post-operative Plan:   Informed Consent: I have reviewed the patients History and Physical, chart, labs and discussed the procedure including the risks, benefits and alternatives for the proposed anesthesia with the patient or authorized representative who has indicated his/her understanding and acceptance.     Dental Advisory Given  Plan Discussed with: CRNA  Anesthesia Plan Comments:         Anesthesia Quick Evaluation

## 2023-07-03 NOTE — Progress Notes (Signed)
 Left adductor canal block performed by Felipe Drone, MD Assisted by Pam Drown

## 2023-07-03 NOTE — Brief Op Note (Signed)
 07/03/2023  10:01 AM  PATIENT:  Stacy [name]  68 y.o. female  PRE-OPERATIVE DIAGNOSIS:  ostoearthritis left knee  POST-OPERATIVE DIAGNOSIS:  ostoearthritis left knee  PROCEDURE:  Procedure(s): TOTAL KNEE ARTHROPLASTY (Left)  SURGEON:  Surgeons and Role:    DEWAINE Margrette Taft FORBES, MD - Primary  PHYSICIAN ASSISTANT:   ASSISTANTS: nicki harley    ANESTHESIA:   spinal and saphenous n block   EBL:  50 mL   BLOOD ADMINISTERED:none  DRAINS: none   LOCAL MEDICATIONS USED:  OTHER zinrelef  SPECIMEN:  No Specimen  DISPOSITION OF SPECIMEN:  N/A  COUNTS:  YES  TOURNIQUET:   Total Tourniquet Time Documented: Thigh (Left) - 88 minutes Total: Thigh (Left) - 88 minutes   DICTATION: .Nechama Dictation  PLAN OF CARE: Admit for overnight observation  PATIENT DISPOSITION:  PACU - hemodynamically stable.   Delay start of Pharmacological VTE agent (>24hrs) due to surgical blood loss or risk of bleeding: yes

## 2023-07-03 NOTE — Anesthesia Procedure Notes (Signed)
 Spinal  Patient location during procedure: OR Start time: 07/03/2023 7:40 AM End time: 07/03/2023 7:50 AM Staffing Performed: resident/CRNA  Resident/CRNA: Cordella Elvie HERO, CRNA Performed by: Cordella Elvie HERO, CRNA Authorized by: Cordella Elvie HERO, CRNA   Preanesthetic Checklist Completed: patient identified, IV checked, site marked, risks and benefits discussed, surgical consent, monitors and equipment checked, pre-op evaluation and timeout performed Spinal Block Patient position: sitting Prep: DuraPrep Patient monitoring: heart rate, cardiac monitor, continuous pulse ox and blood pressure Approach: midline Location: L3-4 Injection technique: single-shot Needle Needle type: Pencan  Needle gauge: 22 G Needle length: 10 cm Assessment Sensory level: T6 Additional Notes Patient tolerated procedure well

## 2023-07-03 NOTE — Plan of Care (Signed)
  Problem: Acute Rehab PT Goals(only PT should resolve) Goal: Pt Will Go Supine/Side To Sit Outcome: Progressing Flowsheets (Taken 07/03/2023 1555) Pt will go Supine/Side to Sit:  Independently  with modified independence Goal: Patient Will Transfer Sit To/From Stand Outcome: Progressing Flowsheets (Taken 07/03/2023 1555) Patient will transfer sit to/from stand:  with modified independence  with supervision Goal: Pt Will Transfer Bed To Chair/Chair To Bed Outcome: Progressing Flowsheets (Taken 07/03/2023 1555) Pt will Transfer Bed to Chair/Chair to Bed:  with modified independence  with supervision Goal: Pt Will Ambulate Outcome: Progressing Flowsheets (Taken 07/03/2023 1555) Pt will Ambulate:  100 feet  with modified independence  with supervision  with rolling walker   3:55 PM, 07/03/23 Lynwood Music, MPT Physical Therapist with [facility] [phone] office [phone] mobile phone

## 2023-07-03 NOTE — Transfer of Care (Signed)
 Immediate Anesthesia Transfer of Care Note  Patient: Stacy [name]  Procedure(s) Performed: TOTAL KNEE ARTHROPLASTY (Left: Knee)  Patient Location: PACU  Anesthesia Type:Spinal  Level of Consciousness: awake, alert , oriented, and patient cooperative  Airway & Oxygen Therapy: Patient Spontanous Breathing  Post-op Assessment: Report given to RN and Post -op Vital signs reviewed and stable  Post vital signs: Reviewed and stable  Last Vitals:  Vitals Value Taken Time  BP 108/55 07/03/23 1005  Temp 98.4 07/03/23 1013  Pulse 82 07/03/23 1013  Resp 16 07/03/23 1013  SpO2 98 % 07/03/23 1013  Vitals shown include unfiled device data.  Last Pain:  Vitals:   07/03/23 0646  TempSrc: Oral  PainSc: 0-No pain      Patients Stated Pain Goal: 6 (07/03/23 9353)  Complications: No notable events documented.

## 2023-07-03 NOTE — Interval H&P Note (Signed)
 History and Physical Interval Note:  07/03/2023 7:24 AM  Stacy [name]  has presented today for surgery, with the diagnosis of ostoearthritis left knee.  The various methods of treatment have been discussed with the patient and family. After consideration of risks, benefits and other options for treatment, the patient has consented to  Procedure(s): TOTAL KNEE ARTHROPLASTY (Left) as a surgical intervention.  The patient's history has been reviewed, patient examined, no change in status, stable for surgery.  I have reviewed the patient's chart and labs.  Questions were answered to the patient's satisfaction.     Taft Minerva

## 2023-07-03 NOTE — Evaluation (Signed)
 Physical Therapy Evaluation Patient Details Name: Stacy [name] MRN: [id number] DOB: [birth year] Today's Date: 07/03/2023   LEFT KNEE ROM: 0 - 105 degrees AMBULATION DISTANCE: 22 feet with Contact guard/Supervision using RW    History of Present Illness  Stacy [name] a 68 y/o female, s/p Left TKA on 07/03/23, with the diagnosis of ostoearthritis left knee.  Clinical Impression  Patient demonstrates good return for completing exercises, transferring to chair and ambulating in room using RW and limited mostly due to fatigue and mild increase in left knee pain.  Patient tolerated sitting up in chair after therapy with her daughter present.  Patient will benefit from continued skilled physical therapy in hospital and recommended venue below to increase strength, balance, endurance for safe ADLs and gait.          If plan is discharge home, recommend the following: A little help with walking and/or transfers;A little help with bathing/dressing/bathroom;Assistance with cooking/housework   Can travel by private vehicle        Equipment Recommendations None recommended by PT  Recommendations for Other Services       Functional Status Assessment Patient has had a recent decline in their functional status and demonstrates the ability to make significant improvements in function in a reasonable and predictable amount of time.     Precautions / Restrictions Precautions Precautions: Fall Restrictions Weight Bearing Restrictions Per Provider Order: Yes LLE Weight Bearing Per Provider Order: Weight bearing as tolerated      Mobility  Bed Mobility Overal bed mobility: Modified Independent             General bed mobility comments: slightly labored movement with fair/good return for completing supine sitting using elbows to hands    Transfers Overall transfer level: Needs assistance Equipment used: Rolling walker (2 wheels) Transfers: Sit to/from Stand, Bed to  chair/wheelchair/BSC Sit to Stand: Supervision, Contact guard assist   Step pivot transfers: Supervision, Contact guard assist       General transfer comment: labored movement, increased time    Ambulation/Gait Ambulation/Gait assistance: Contact guard assist, Supervision Gait Distance (Feet): 22 Feet Assistive device: Rolling walker (2 wheels) Gait Pattern/deviations: Decreased step length - right, Decreased stride length, Decreased step length - left, Decreased stance time - left, Antalgic Gait velocity: decreased     General Gait Details: labored cadence with fair carryover for left heel to toe stepping, limited mostly due to c/o fatigue and mild increase in left knee pain  Stairs            Wheelchair Mobility     Tilt Bed    Modified Rankin (Stroke Patients Only)       Balance Overall balance assessment: Needs assistance Sitting-balance support: Feet supported, No upper extremity supported Sitting balance-Leahy Scale: Good Sitting balance - Comments: seated at EOB   Standing balance support: Reliant on assistive device for balance, During functional activity, Bilateral upper extremity supported Standing balance-Leahy Scale: Fair Standing balance comment: fair/good using RW                             Pertinent Vitals/Pain Pain Assessment Pain Assessment: 0-10 Pain Score: 3  Pain Location: left knee with end range movement Pain Descriptors / Indicators: Sore, Grimacing, Discomfort Pain Intervention(s): Limited activity within patient's tolerance, Monitored during session, Repositioned    Home Living Family/patient expects to be discharged to:: Private residence Living Arrangements: Spouse/significant other;Children Available Help at Discharge: Family Type of Home:  House Home Access: Stairs to enter Entrance Stairs-Rails: Left;Right;Can reach both Secretary/administrator of Steps: 4   Home Layout: One level Home Equipment: Rollator (4  wheels)      Prior Function Prior Level of Function : Independent/Modified Independent             Mobility Comments: Community ambulation wihtout AD ADLs Comments: Independent     Extremity/Trunk Assessment   Upper Extremity Assessment Upper Extremity Assessment: Overall WFL for tasks assessed    Lower Extremity Assessment Lower Extremity Assessment: Generalized weakness;LLE deficits/detail LLE Deficits / Details: grossly -4/5 LLE: Unable to fully assess due to pain LLE Sensation: WNL LLE Coordination: WNL    Cervical / Trunk Assessment Cervical / Trunk Assessment: Normal  Communication   Communication Communication: No apparent difficulties  Cognition Arousal: Alert Behavior During Therapy: WFL for tasks assessed/performed Overall Cognitive Status: Within Functional Limits for tasks assessed                                          General Comments      Exercises Total Joint Exercises Ankle Circles/Pumps: Supine, 10 reps, Left, Strengthening, AROM Quad Sets: AROM, Strengthening, Left, 10 reps, Supine Short Arc Quad: AROM, Strengthening, Left, 10 reps, Supine Heel Slides: AROM, Strengthening, Left, 10 reps, Supine Goniometric ROM: left knee: 0 - 105 degrees   Assessment/Plan    PT Assessment Patient needs continued PT services  PT Problem List Decreased strength;Decreased activity tolerance;Decreased range of motion;Decreased balance;Decreased mobility;Pain       PT Treatment Interventions DME instruction;Gait training;Stair training;Functional mobility training;Therapeutic activities;Therapeutic exercise;Balance training;Patient/family education    PT Goals (Current goals can be found in the Care Plan section)  Acute Rehab PT Goals Patient Stated Goal: return home with family to assist PT Goal Formulation: With patient/family Time For Goal Achievement: 07/05/23 Potential to Achieve Goals: Good    Frequency BID     Co-evaluation                AM-PAC PT 6 Clicks Mobility  Outcome Measure Help needed turning from your back to your side while in a flat bed without using bedrails?: None Help needed moving from lying on your back to sitting on the side of a flat bed without using bedrails?: None Help needed moving to and from a bed to a chair (including a wheelchair)?: A Little Help needed standing up from a chair using your arms (e.g., wheelchair or bedside chair)?: A Little Help needed to walk in hospital room?: A Little Help needed climbing 3-5 steps with a railing? : A Lot 6 Click Score: 19    End of Session   Activity Tolerance: Patient tolerated treatment well;Patient limited by fatigue Patient left: in chair;with call bell/phone within reach;with family/visitor present Nurse Communication: Mobility status PT Visit Diagnosis: Unsteadiness on feet (R26.81);Other abnormalities of gait and mobility (R26.89);Muscle weakness (generalized) (M62.81)    Time: 8494-8464 PT Time Calculation (min) (ACUTE ONLY): 30 min   Charges:   PT Evaluation $PT Eval Moderate Complexity: 1 Mod PT Treatments $Therapeutic Activity: 23-37 mins PT General Charges $$ ACUTE PT VISIT: 1 Visit         3:54 PM, 07/03/23 Lynwood Music, MPT Physical Therapist with [facility] [phone] office [phone] mobile phone

## 2023-07-03 NOTE — Op Note (Signed)
 Dictation for total knee replacement  Orthopaedic Surgery Operative Note (CSN: [id number])  Stacy [name]  Dec 16, 1955 Date of Surgery: 07/03/2023   Diagnoses:  ostoearthritis left knee  Procedure: LT TKA    TXA [used]   Operative Finding SEVERE OA ALL 3 COMPARTMENTS VARUS MODERATE 10 FLEX CONTRACTURE < 5  OSTEOPHYTES MEDIAL LATERAL POSTERIOR   Bone cuts Distal femur - 9  Proximal tibia - 9  Patella - 20.5 CUT 7.5   Post-Op Diagnosis: Same Surgeons:Primary: Margrette Taft BRAVO, MD Assistants: DUFM CONSTANT  Location: [facility] OR ROOM 4 Anesthesia: SPINAL PLUS POST OP SAPHENOUS NERVE BLOCK  Antibiotics: Ancef  2 g Tourniquet time: Estimated Blood Loss: 50CC Complications: None Specimens: None   Implants: Implant Name Type Inv. Item Serial No. Manufacturer Lot No. LRB No. Used Action  CEMENT HV SMART SET - [id number] Cement CEMENT HV SMART SET  [employer] [id number] Left 1 Implanted  INSERT TIB FIX BEARNG SZ 5 - [id number] Insert INSERT TIB FIX BEARNG SZ 5  DEPU[employer] OR[employer] [id number] Left 1 Implanted  ATTUNE MED DOME PAT 32 KNEE - [id number] Knees ATTUNE MED DOME PAT 32 KNEE  DEPU[employer] ORTH[employer] [id number] Left 1 Implanted  ATTUNE PS FEM LT SZ 5 CEM KNEE - [id number] Femur ATTUNE PS FEM LT SZ 5 CEM KNEE  DEPUY ORTHOPA[employer] [id number] Left 1 Implanted  BASEPLATE TIB CMT FB PCKT SZ4 - [id number] Stem BASEPLATE TIB CMT FB PCKT SZ4  DEPU[employer] ORTH[employer] [id number] Left 1 Implanted     Indications for Surgery:   Disabling pain of the  LEFT knee which did not improve despite nonoperative measures. The benefits and risks of operative and nonoperative management were discussed prior to surgery with patient/guardian(s) and informed consent form was completed.  While all risks cannot be anticipated, specific risks including infection, need for additional surgery, stiffness, postop pain, infection, implant removal, loosening, infection requiring amputation, deep vein  thrombosis, pulmonary embolus were discussed.   Procedure:    Details of surgery: The patient was identified by 2 approved identification mechanisms. The operative extremity was evaluated and found to be acceptable for surgical treatment today. The chart was reviewed. The surgical site was confirmed and marked over the  LEFT knee   Saphenous nerve block was planned and completed in the PACU  YES  [Y/N]   The patient was taken to the operating room and given anbiotics 2gm ANCEF .  This is consistent with the SCIP protocol.  The patient was given the following anesthetic: SPINAL   The patient was then placed supine on the operating table. A Foley catheter was inserted. The operative extremity was prepped and draped sterilely from the toes to the groin.  Timeout was executed confirming the patient's name, surgical site, antibiotic administration, x-rays available, and implants available.  The operative limb,  was exsanguinated with a six-inch Esmarch and the tourniquet was inflated to 275 mmHg.  A straight midline incision was made over the LEFT KNEE and taken down to the extensor mechanism. A medial arthrotomy was performed. The patella was everted and the patellofemoral soft tissue was released, along with the patellar fat pad.  The anterior cruciate ligament and PCL were resected. The anterior horns of the lateral and medial meniscus were resected. The medial soft tissue sleeve was elevated to the POSTERIOR CORNER  PERIPHERAL FEMORAL AND TIBIAL OSTEOPHYTES WERE REMOVED    A three-eighths inch drill bit was used to enter the femoral canal which was decompressed with suction and irrigation until clear.  The  distal femoral cutting guide was set for 9 mm distal resection,  5valgus alignment, for a LEFT  knee. The distal femur was resected and checked for flatness. The attune sizing femoral guide was placed and the femur was preliminarily sized to a size 5 .    The external alignment guide  for the tibial resection was then applied to the distal and proximal tibia and set for 3 degree posterior slope along with 9 MM resection  from the  LATERAL SIDE  .  Rotational alignment was set using the malleolus, the tibial tubercle and the tibial spines. The proximal tibia was resected along with  residual menisci. The tibia was sized using a base plate to a size  4 .    The extension gap was checked.  The spacer block which balanced extension gap was a size 5/6 The femur size was rechecked and measured a 5 The femoral cutting block was placed.  At this point we BASED  the position of the femoral cutting block on the size of the extension gap by placing the spacer block that balanced the extension gap underneath the femoral cutting block. Stability was checked by rotating the hip and checking for opening on the medial and lateral side.  The block was moved down [Y/N] YES  Rotation was assessed using Whitesides line and the epicondyles.  Once I was satisfied with the spacer block collateral ligament retractors were placed and I completed the 4 distal femoral cuts   The extension gap was rechecked with the size 5/6 spacer block.  The flexion and extension gaps were now balanced and I proceeded to cut the notch in the femur. The correct sized notch cutting guide for the femur was then applied and the notch cut was made.   Trial reduction was completed using size 59F 4T AND 5 POLY TRIAL ( THE 6 DID NOT FIT ) trial implants. Patella tracking was normal  We then skeletonized the patella. It measured 20.5 in thickness and the patellar resection was set for 7.5 millimeters. the patellar resection was completed. The patella diameter measured 32. We then drilled the peg holes for the patella.     The proximal tibia was prepared using the size 4 base plate. DRILL HOLES WERE PALCED IN THE MEDIAL PLATEAU   Thorough irrigation was performed using saline  and the bone was dried and prepared for  cement. The cement was mixed on the back table using third generation preparation techniques  The implants were then cemented in place and excess cement was removed. The cement was allowed to cure.  Normal saline irrigation followed by Surgipor irrigation followed by normal saline irrigation  Any excess bone fragments and cement were removed.  The extensor mechanism was closed with #1 Bralon suture.  A small opening was left open to in circles and relief and then closure with #1 Braylon, followed by subcutaneous tissue closure using 0 Monocryl suture in 2 layers    Skin approximation was performed using staples  A sterile dressing was applied, TED hose were placed on the operative extremity followed by Cryo/Cuff.  The patient was taken recovery room in stable condition  Postop plan: Weightbearing as tolerated CPM machine Immediate physical therapy Discharge tomorrow if stable

## 2023-07-04 ENCOUNTER — Encounter (HOSPITAL_COMMUNITY): Payer: Self-pay | Admitting: Orthopedic Surgery

## 2023-07-04 DIAGNOSIS — M1712 Unilateral primary osteoarthritis, left knee: Secondary | ICD-10-CM | POA: Diagnosis not present

## 2023-07-04 LAB — CBC
HCT: 33 % — ABNORMAL LOW (ref 36.0–46.0)
Hemoglobin: 10.1 g/dL — ABNORMAL LOW (ref 12.0–15.0)
MCH: 27.2 pg (ref 26.0–34.0)
MCHC: 30.6 g/dL (ref 30.0–36.0)
MCV: 88.7 fL (ref 80.0–100.0)
Platelets: 209 10*3/uL (ref 150–400)
RBC: 3.72 MIL/uL — ABNORMAL LOW (ref 3.87–5.11)
RDW: 15.4 % (ref 11.5–15.5)
WBC: 12.7 10*3/uL — ABNORMAL HIGH (ref 4.0–10.5)
nRBC: 0 % (ref 0.0–0.2)

## 2023-07-04 LAB — BASIC METABOLIC PANEL
Anion gap: 8 (ref 5–15)
BUN: 23 mg/dL (ref 8–23)
CO2: 22 mmol/L (ref 22–32)
Calcium: 8.4 mg/dL — ABNORMAL LOW (ref 8.9–10.3)
Chloride: 108 mmol/L (ref 98–111)
Creatinine, Ser: 1.09 mg/dL — ABNORMAL HIGH (ref 0.44–1.00)
GFR, Estimated: 55 mL/min — ABNORMAL LOW (ref >60)
Glucose, Bld: 151 mg/dL — ABNORMAL HIGH (ref 70–99)
Potassium: 3.4 mmol/L — ABNORMAL LOW (ref 3.5–5.1)
Sodium: 138 mmol/L (ref 135–145)

## 2023-07-04 MED ORDER — METHOCARBAMOL 500 MG PO TABS: 500.0000 mg | ORAL_TABLET | Freq: Four times a day (QID) | ORAL | 2 refills | Status: DC | PRN

## 2023-07-04 MED ORDER — OXYCODONE HCL 5 MG PO TABS: 5.0000 mg | ORAL_TABLET | ORAL | 0 refills | Status: AC | PRN

## 2023-07-04 MED ORDER — ACETAMINOPHEN 500 MG PO TABS: 500.0000 mg | ORAL_TABLET | Freq: Four times a day (QID) | ORAL | 0 refills | Status: AC | PRN

## 2023-07-04 MED ORDER — ASPIRIN 81 MG PO CHEW: 81.0000 mg | CHEWABLE_TABLET | Freq: Two times a day (BID) | ORAL | 0 refills | Status: AC

## 2023-07-04 MED ORDER — POLYETHYLENE GLYCOL 3350 17 G PO PACK: 17.0000 g | PACK | Freq: Every day | ORAL | 0 refills | Status: AC | PRN

## 2023-07-04 MED ORDER — DOCUSATE SODIUM 100 MG PO CAPS: 100.0000 mg | ORAL_CAPSULE | Freq: Two times a day (BID) | ORAL | 0 refills | Status: AC

## 2023-07-04 NOTE — Progress Notes (Signed)
 Physical Therapy Treatment Patient Details Name: Stacy [name] MRN: [id number] DOB: 02-05-1956 Today's Date: 07/04/2023   History of Present Illness Stacy [name] a 68 y/o female, s/p Left TKA on 07/03/23, with the diagnosis of ostoearthritis left knee.    PT Comments  Lt knee ROM:  100 degrees active Ambulation:  150 feet RW SBA Stairs:  3 step to with bil HR CGA Pt sitting in bed, eager to work with therapist and ready to go home.  Pt with little fatigue noted with gait to and from stairwell, approx 150 feet.  Pt did rest during therapist demonstration of stairs.  CGA with stair negotiation . PT with no further needs or concerns at this time.  Family member present during sessio and also educated with general mobilty and gait to complete at home safely.  Pt has met all goals currently with plans for discharge to home today.   If plan is discharge home, recommend the following: A little help with walking and/or transfers;A little help with bathing/dressing/bathroom;Assistance with cooking/housework   Can travel by private vehicle      yes  Equipment Recommendations  None recommended by PT       Precautions / Restrictions Restrictions Weight Bearing Restrictions Per Provider Order: Yes LLE Weight Bearing Per Provider Order: Weight bearing as tolerated     Mobility  Bed Mobility Overal bed mobility: Modified Independent             General bed mobility comments: some assistance with Lt LE getting back into bed, otherwise independent    Transfers Overall transfer level: Needs assistance Equipment used: Rolling walker (2 wheels) Transfers: Sit to/from Stand, Bed to chair/wheelchair/BSC Sit to Stand: Supervision   Step pivot transfers: Supervision       General transfer comment: labored movement, increased time    Ambulation/Gait Ambulation/Gait assistance: Supervision Gait Distance (Feet): 150 Feet Assistive device: Rolling walker (2 wheels) Gait  Pattern/deviations: Decreased step length - right, Decreased stride length, Decreased step length - left, Decreased stance time - left, Antalgic Gait velocity: decreased     General Gait Details: limited fatigue today, overall improving with activity tolerance and gait quality   Stairs Stairs: Yes Stairs assistance: Supervision Stair Management: Two rails Number of Stairs: 3 General stair comments: no difficulties completing stairs        Cognition Arousal: Alert Behavior During Therapy: WFL for tasks assessed/performed Overall Cognitive Status: Within Functional Limits for tasks assessed                                          Exercises General Exercises - Lower Extremity Quad Sets: AROM, Left, 5 reps Long Arc Quad: AROM, Left, 5 reps Heel Slides: AROM, Left, 5 reps      PT Goals (current goals can now be found in the care plan section) Acute Rehab PT Goals Patient Stated Goal: return home with family to assist PT Goal Formulation: With patient/family Time For Goal Achievement: 07/05/23 Potential to Achieve Goals: Good Progress towards PT goals: Goals met and updated - see care plan    Frequency    BID       AM-PAC PT 6 Clicks Mobility   Outcome Measure  Help needed turning from your back to your side while in a flat bed without using bedrails?: None Help needed moving from lying on your back to sitting on the side of  a flat bed without using bedrails?: None Help needed moving to and from a bed to a chair (including a wheelchair)?: A Little Help needed standing up from a chair using your arms (e.g., wheelchair or bedside chair)?: A Little Help needed to walk in hospital room?: A Little Help needed climbing 3-5 steps with a railing? : A Little 6 Click Score: 20    End of Session Equipment Utilized During Treatment: Gait belt Activity Tolerance: Patient tolerated treatment well Patient left: with family/visitor present;in bed Nurse  Communication: Mobility status PT Visit Diagnosis: Unsteadiness on feet (R26.81);Other abnormalities of gait and mobility (R26.89);Muscle weakness (generalized) (M62.81)     Time: 0950-1005 PT Time Calculation (min) (ACUTE ONLY): 15 min  Charges:    $Gait Training: 8-22 mins PT General Charges $$ ACUTE PT VISIT: 1 Visit                    Greig KATHEE Fuse, PTA/CLT [facility] Ph: [phone]    Fuse Greig KATHEE 07/04/2023, 11:13 AM

## 2023-07-04 NOTE — Progress Notes (Signed)
 Patient has rested well. Pain controlled with po medication.  Vitals have been stable and foley removed this am. No acute events overnight.

## 2023-07-04 NOTE — Progress Notes (Signed)
 Subjective: 1 Day Post-Op Procedure(s) (LRB): TOTAL KNEE ARTHROPLASTY (Left) Patient reports pain as mild.    Objective: Vital signs in last 24 hours:BP (!) 140/65   Pulse 66   Temp 98.1 F (36.7 C)   Resp 18   Ht 5' 3 (1.6 m)   Wt 97.5 kg   SpO2 100%   BMI 38.09 kg/m   Intake/Output from previous day: 01/07 0701 - 01/08 0700 In: 1800 [P.O.:600; I.V.:1000; IV Piggyback:200] Out: 1250 [Urine:1200; Blood:50] Intake/Output this shift: Total I/O In: 240 [P.O.:240] Out: -   Recent Labs    07/04/23 0406  HGB 10.1*   Recent Labs    07/04/23 0406  WBC 12.7*  RBC 3.72*  HCT 33.0*  PLT 209   Recent Labs    07/04/23 0406  NA 138  K 3.4*  CL 108  CO2 22  BUN 23  CREATININE 1.09*  GLUCOSE 151*  CALCIUM  8.4*   No results for input(s): LABPT, INR in the last 72 hours.  Neurologically intact Neurovascular intact Sensation intact distally Intact pulses distally Dorsiflexion/Plantar flexion intact Incision: scant drainage Compartment soft   Assessment/Plan: 1 Day Post-Op Procedure(s) (LRB): TOTAL KNEE ARTHROPLASTY (Left) Advance diet Up with therapy D/C IV fluids Discharge home with home health   Taft Minerva 07/04/2023, 9:34 AM

## 2023-07-04 NOTE — Care Management Obs Status (Signed)
 MEDICARE OBSERVATION STATUS NOTIFICATION   Patient Details  Name: Stacy [name] MRN: [id number] Date of Birth: [birth year]   Medicare Observation Status Notification Given:  Yes Stacy Stacy HERO., [name], verbally reviewed observation notice telephonically with Stacy [name] at [phone]. Consent for verbal signature provided to Stacy [name]. Copy left at bedside as requested due to physical therapy present to work with Stacy [name])    Stacy [name] 07/04/2023, 9:56 AM

## 2023-07-04 NOTE — Discharge Summary (Signed)
 Physician Discharge Summary  Patient ID: Stacy [name] MRN: [id number] DOB/AGE: [birth year] 69 y.o.  Admit date: 07/03/2023 Discharge date: 07/04/2023  Admission Diagnoses:  Discharge Diagnoses:  Principal Problem:   Primary osteoarthritis of left knee   Discharged Condition: good  Hospital Course:   Day 1 LT TKA  DAY 2 CONTINUED PT   Consults: None  Significant Diagnostic Studies: labs:     Latest Ref Rng & Units 07/04/2023    4:06 AM 06/28/2023    3:28 PM 09/28/2022    5:16 AM  CBC  WBC 4.0 - 10.5 K/uL 12.7  8.8  15.1   Hemoglobin 12.0 - 15.0 g/dL 89.8  87.0  88.3   Hematocrit 36.0 - 46.0 % 33.0  41.7  37.4   Platelets 150 - 400 K/uL 209  317  250       Latest Ref Rng & Units 07/04/2023    4:06 AM 06/28/2023    3:28 PM 09/27/2022    4:08 AM  BMP  Glucose 70 - 99 mg/dL 848  85  69   BUN 8 - 23 mg/dL 23  30  34   Creatinine 0.44 - 1.00 mg/dL 8.90  8.82  9.06   Sodium 135 - 145 mmol/L 138  141  138   Potassium 3.5 - 5.1 mmol/L 3.4  3.4  3.8   Chloride 98 - 111 mmol/L 108  106  109   CO2 22 - 32 mmol/L 22  24  19    Calcium  8.9 - 10.3 mg/dL 8.4  9.6  8.1     Discharge Exam: Blood pressure (!) 140/65, pulse 66, temperature 98.1 F (36.7 C), resp. rate 18, height 5' 3 (1.6 m), weight 97.5 kg, SpO2 100%. Incision/Wound: SCANT DRAINAGE   Physical Exam Vitals reviewed.  Constitutional:      General: She is not in acute distress.    Appearance: Normal appearance. She is not ill-appearing.  HENT:     Head: Normocephalic and atraumatic.  Eyes:     General: No scleral icterus.       Right eye: No discharge.        Left eye: No discharge.  Cardiovascular:     Rate and Rhythm: Normal rate.     Pulses: Normal pulses.  Pulmonary:     Effort: Pulmonary effort is normal.     Breath sounds: No stridor. No wheezing or rhonchi.  Musculoskeletal:     Comments: NO EDEMA IN THE ANKLE AREAS   CALF SOFT     Skin:    General: Skin is warm and dry.     Capillary Refill:  Capillary refill takes less than 2 seconds.  Neurological:     General: No focal deficit present.     Mental Status: She is alert and oriented to person, place, and time.  Psychiatric:        Mood and Affect: Mood normal.        Behavior: Behavior normal.        Thought Content: Thought content normal.        Judgment: Judgment normal.    Physical Therapy Evaluation Patient Details Name: Stacy [name] MRN: [id number] DOB: Nov 27, 1955 Today's Date: 07/03/2023     LEFT KNEE ROM: 0 - 105 degrees AMBULATION DISTANCE: 22 feet with Contact guard/Supervision using RW    Implants: Implant Name Type Inv. Item Serial No. Manufacturer Lot No. LRB No. Used Action  CEMENT HV SMART SET - [id number] Cement CEMENT HV SMART SET  [employer] [id number] Left 1 Implanted  INSERT TIB FIX BEARNG SZ 5 - [id number] Insert INSERT TIB FIX BEARNG SZ 5   [employer] [id number] Left 1 Implanted  ATTUNE MED DOME PAT 32 KNEE - [id number] Knees ATTUNE MED DOME PAT 32 KNEE   [employer]PU[employer] [id number] Left 1 Implanted  ATTUNE PS FEM LT SZ 5 CEM KNEE - [id number] Femur ATTUNE PS FEM LT SZ 5 CEM KNEE   [employer] [id number] Left 1 Implanted  BASEPLATE TIB CMT FB PCKT SZ4 - [id number] Stem BASEPLATE TIB CMT FB PCKT SZ4   DEPU[employer] ORTH[employer] [id number] Left 1 Implanted      Disposition: Discharge disposition: 01-Home or Self Care       Discharge Instructions     Call MD / Call 911   Complete by: As directed    If you experience chest pain or shortness of breath, CALL 911 and be transported to the hospital emergency room.  If you develope a fever above 101 F, pus (white drainage) or increased drainage or redness at the wound, or calf pain, call your surgeon's office.   Constipation Prevention   Complete by: As directed    Drink plenty of fluids.  Prune juice may be helpful.  You may use a stool softener, such as Colace (over the counter) 100 mg twice a day.  Use MiraLax  (over the  counter) for constipation as needed.   Diet - low sodium heart healthy   Complete by: As directed    Discharge instructions   Complete by: As directed    Apply the ice 30 minutes every 2 hrs while awake   Blue foam 30 minutes 3 x a day  CPM Machine 4 hrs per day. You may divide the time as tolerated START 0-80 AND INCREASE 10 DEGREES PER DAY  RETURN MACHINE AFTER 2 WEEKS   ASPIRIN  TAKE FOR 45 DAYS   WEAR STOCKINGS FOR 6 WEEKS   Increase activity slowly as tolerated   Complete by: As directed    Post-operative opioid taper instructions:   Complete by: As directed    POST-OPERATIVE OPIOID TAPER INSTRUCTIONS: It is important to wean off of your opioid medication as soon as possible. If you do not need pain medication after your surgery it is ok to stop day one. Opioids include: Codeine , Hydrocodone (Norco, Vicodin), Oxycodone (Percocet, oxycontin ) and hydromorphone  amongst others.  Long term and even short term use of opiods can cause: Increased pain response Dependence Constipation Depression Respiratory depression And more.  Withdrawal symptoms can include Flu like symptoms Nausea, vomiting And more Techniques to manage these symptoms Hydrate well Eat regular healthy meals Stay active Use relaxation techniques(deep breathing, meditating, yoga) Do Not substitute Alcohol to help with tapering If you have been on opioids for less than two weeks and do not have pain than it is ok to stop all together.  Plan to wean off of opioids This plan should start within one week post op of your joint replacement. Maintain the same interval or time between taking each dose and first decrease the dose.  Cut the total daily intake of opioids by one tablet each day Next start to increase the time between doses. The last dose that should be eliminated is the evening dose.         Allergies as of 07/04/2023   No Known Allergies      Medication List     STOP taking these medications     acetaminophen -codeine  300-30 MG  tablet Commonly known as: TYLENOL  #3   aspirin -acetaminophen -caffeine 250-250-65 MG tablet Commonly known as: EXCEDRIN MIGRAINE   diclofenac  75 MG EC tablet Commonly known as: VOLTAREN    traMADol  50 MG tablet Commonly known as: ULTRAM        TAKE these medications    acetaminophen  500 MG tablet Commonly known as: TYLENOL  Take 1 tablet (500 mg total) by mouth every 6 (six) hours as needed.   aspirin  81 MG chewable tablet Chew 1 tablet (81 mg total) by mouth 2 (two) times daily.   atorvastatin  20 MG tablet Commonly known as: LIPITOR Take 20 mg by mouth in the morning.   docusate sodium  100 MG capsule Commonly known as: COLACE Take 1 capsule (100 mg total) by mouth 2 (two) times daily.   ferrous sulfate  325 (65 FE) MG tablet Take 325 mg by mouth in the morning.   hydrochlorothiazide  25 MG tablet Commonly known as: HYDRODIURIL  Take 25 mg by mouth in the morning.   losartan  50 MG tablet Commonly known as: COZAAR  Take 50 mg by mouth in the morning.   methocarbamol  500 MG tablet Commonly known as: ROBAXIN  Take 1 tablet (500 mg total) by mouth every 6 (six) hours as needed for muscle spasms.   oxyCODONE  5 MG immediate release tablet Commonly known as: Oxy IR/ROXICODONE  Take 1 tablet (5 mg total) by mouth every 4 (four) hours as needed for up to 5 days for severe pain (pain score 7-10).   polyethylene glycol 17 g packet Commonly known as: MIRALAX  / GLYCOLAX  Take 17 g by mouth daily as needed for mild constipation.   Tylenol  Sinus Severe 5-325-200 MG Tabs Generic drug: Phenylephrine -APAP-guaiFENesin  Take 2 tablets by mouth daily as needed (sinus congestion).   Vitamin D  (Ergocalciferol ) 1.25 MG (50000 UNIT) Caps capsule Commonly known as: DRISDOL  Take 50,000 Units by mouth every 14 (fourteen) days.         Signed: Taft Minerva 07/04/2023, 9:43 AM

## 2023-07-05 LAB — TYPE AND SCREEN
ABO/RH(D): O POS
Antibody Screen: NEGATIVE
Unit division: 0
Unit division: 0

## 2023-07-05 LAB — BPAM RBC
Blood Product Expiration Date: 202502032359
Blood Product Expiration Date: 202502072359
Unit Type and Rh: 5100
Unit Type and Rh: 5100

## 2023-07-05 NOTE — Anesthesia Postprocedure Evaluation (Signed)
 Anesthesia Post Note  Patient: Stacy [name]  Procedure(s) Performed: TOTAL KNEE ARTHROPLASTY (Left: Knee)  Patient location during evaluation: Phase II Anesthesia Type: Spinal Level of consciousness: awake Pain management: pain level controlled Vital Signs Assessment: post-procedure vital signs reviewed and stable Respiratory status: spontaneous breathing and respiratory function stable Cardiovascular status: blood pressure returned to baseline and stable Postop Assessment: no headache and no apparent nausea or vomiting Anesthetic complications: no Comments: Late entry   No notable events documented.   Last Vitals:  Vitals:   07/03/23 2029 07/04/23 0325  BP: (!) 149/65 (!) 140/65  Pulse: 63 66  Resp: 18 18  Temp: 36.8 C 36.7 C  SpO2: 96% 100%    Last Pain:  Vitals:   07/04/23 1339  TempSrc:   PainSc: 5                  Yvonna JINNY Bosworth

## 2023-07-10 ENCOUNTER — Telehealth: Payer: Self-pay | Admitting: Orthopedic Surgery

## 2023-07-10 NOTE — Telephone Encounter (Signed)
 Dr. Areatha pt - spoke w/the patient, she stated that she had surgery 1/07 and her lt foot is a little swollen, rt foot is a deep red, not hot to the touch, having pain propping them up or trying to stand on them.  She stated it really hurts to move.  She is not able to come in to be checked until Thursday due to transportation.  [phone]

## 2023-07-10 NOTE — Telephone Encounter (Signed)
 I called the patient and advised Dr. Mort Sawyers instructions, she verbalized understanding.

## 2023-07-11 NOTE — Progress Notes (Signed)
  Intake history:  BP (!) 162/109   Pulse (!) 106   Ht 5\' 3"  (1.6 m)   Wt 215 lb (97.5 kg)   BMI 38.09 kg/m  Body mass index is 38.09 kg/m.    WHAT ARE WE SEEING YOU FOR TODAY?   bilateral foot/feet thinks it may be gout   How long has this bothered you? No injury   A few days   Anticoag.  No  Diabetes No  Heart disease No  Hypertension Yes  SMOKING HX No  Kidney disease No/ most recent GFR 55  Any ALLERGIES ______________________________________________   Treatment:  Have you taken:  Tylenol Yes  Advil No  Had PT No  Had injection No  Other  _________________________

## 2023-07-12 ENCOUNTER — Ambulatory Visit: Payer: Medicare PPO | Admitting: Orthopedic Surgery

## 2023-07-12 VITALS — BP 162/109 | HR 106 | Ht 63.0 in | Wt 215.0 lb

## 2023-07-12 DIAGNOSIS — M7989 Other specified soft tissue disorders: Secondary | ICD-10-CM

## 2023-07-12 DIAGNOSIS — Z96652 Presence of left artificial knee joint: Secondary | ICD-10-CM

## 2023-07-12 DIAGNOSIS — M1A0790 Idiopathic chronic gout, unspecified ankle and foot, without tophus (tophi): Secondary | ICD-10-CM | POA: Diagnosis not present

## 2023-07-12 MED ORDER — PREDNISONE 10 MG (48) PO TBPK: ORAL_TABLET | Freq: Every day | ORAL | 0 refills | Status: AC

## 2023-07-12 NOTE — Progress Notes (Signed)
  Subjective:     Patient ID: Stacy [name], female   DOB: 1955/07/26, 68 y.o.   MRN: [id number]  68 years old recent total knee on the left comes in with bilateral foot pain which she describes as possibly her gout symptoms which usually manifest in 1 foot and then bounced to the other.  She has had prednisone in the past to help  Her left total knee replacement postop course is going well  Foot Pain Pertinent negatives include no chills or fever.     Review of Systems  Constitutional:  Negative for chills and fever.       Objective:   Physical Exam   Both feet are swollen left greater than right appear to be warm to touch and very tender to touch Assessment:     Encounter Diagnoses  Name Primary?   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    Dispense:  48 tablet    Refill:  0    Recheck when she comes in for her first postop visit

## 2023-07-18 DIAGNOSIS — Z96652 Presence of left artificial knee joint: Secondary | ICD-10-CM | POA: Insufficient documentation

## 2023-07-18 NOTE — Progress Notes (Unsigned)
   There were no vitals taken for this visit.  There is no height or weight on file to calculate BMI.  No chief complaint on file.   Encounter Diagnosis  Name Primary?   S/P total knee replacement, left1/7/25 Yes    DOI/DOS/ Date: 07/03/23  {CHL AMB ORT SYMPTOMS POST TREATMENT:21798}

## 2023-07-19 ENCOUNTER — Encounter: Payer: Self-pay | Admitting: Orthopedic Surgery

## 2023-07-19 ENCOUNTER — Ambulatory Visit: Payer: Medicare PPO | Admitting: Orthopedic Surgery

## 2023-07-19 DIAGNOSIS — Z96652 Presence of left artificial knee joint: Secondary | ICD-10-CM

## 2023-07-19 DIAGNOSIS — G8918 Other acute postprocedural pain: Secondary | ICD-10-CM

## 2023-07-19 MED ORDER — HYDROCODONE-ACETAMINOPHEN 10-325 MG PO TABS: 1.0000 | ORAL_TABLET | Freq: Four times a day (QID) | ORAL | 0 refills | Status: AC | PRN

## 2023-07-19 NOTE — Progress Notes (Signed)
POST OP TKA    Chief Complaint  Patient presents with   Post-op Follow-up    L TKR     Encounter Diagnosis  Name Primary?   S/P total knee replacement, left1/7/25 Yes    DOI/DOS/ Date: 07/03/23  Improved  Staples removed without difficulty  Suture line looks excellent full extension noted flexion 90 degrees  Edema medication refilled with appropriate tapering with goal of 6 weeks off of  Return in 4 weeks  Meds ordered this encounter  Medications   HYDROcodone-acetaminophen (NORCO) 10-325 MG tablet    Sig: Take 1 tablet by mouth every 6 (six) hours as needed.    Dispense:  30 tablet    Refill:  0

## 2023-08-09 ENCOUNTER — Encounter: Payer: Self-pay | Admitting: Orthopedic Surgery

## 2023-08-09 ENCOUNTER — Ambulatory Visit (INDEPENDENT_AMBULATORY_CARE_PROVIDER_SITE_OTHER): Payer: Medicare PPO | Admitting: Orthopedic Surgery

## 2023-08-09 VITALS — Ht 63.0 in | Wt 217.0 lb

## 2023-08-09 DIAGNOSIS — Z96652 Presence of left artificial knee joint: Secondary | ICD-10-CM

## 2023-08-09 DIAGNOSIS — L7682 Other postprocedural complications of skin and subcutaneous tissue: Secondary | ICD-10-CM

## 2023-08-09 MED ORDER — DOXYCYCLINE HYCLATE 100 MG PO TABS: 100.0000 mg | ORAL_TABLET | Freq: Two times a day (BID) | ORAL | 1 refills | Status: DC

## 2023-08-09 MED ORDER — MEDIHONEY WOUND/BURN DRESSING EX PSTE
PASTE | CUTANEOUS | 0 refills | Status: AC
Start: 2023-08-09 — End: ?

## 2023-08-09 NOTE — Progress Notes (Signed)
   There were no vitals taken for this visit.  There is no height or weight on file to calculate BMI.  No chief complaint on file.   Encounter Diagnosis  Name Primary?   S/P total knee replacement, left1/7/25 Yes    DOI/DOS/ Date: 07/03/23  Worse

## 2023-08-09 NOTE — Progress Notes (Signed)
Postop visit unscheduled  Encounter Diagnoses  Name Primary?   S/P total knee replacement, left1/7/25    Postoperative complication of skin involving drainage from surgical wound Yes    68 year old female status post left total knee came in for evaluation of distal incision which opened up last night  Previous right total knee had similar issue treated with honey dressing and doxycycline  Picture has been taken to include in the medical record in the media section  Start honey dressings and doxycycline return 2 weeks for wound check

## 2023-08-09 NOTE — Addendum Note (Signed)
Addended byCaffie Damme on: 08/09/2023 02:41 PM   Modules accepted: Orders

## 2023-08-16 ENCOUNTER — Encounter: Payer: Medicare PPO | Admitting: Orthopedic Surgery

## 2023-08-23 ENCOUNTER — Encounter: Payer: Medicare PPO | Admitting: Orthopedic Surgery

## 2023-08-27 ENCOUNTER — Other Ambulatory Visit: Payer: Self-pay | Admitting: Orthopedic Surgery

## 2023-08-27 ENCOUNTER — Ambulatory Visit (INDEPENDENT_AMBULATORY_CARE_PROVIDER_SITE_OTHER): Payer: Medicare PPO | Admitting: Orthopedic Surgery

## 2023-08-27 DIAGNOSIS — Z96652 Presence of left artificial knee joint: Secondary | ICD-10-CM

## 2023-08-27 DIAGNOSIS — L7682 Other postprocedural complications of skin and subcutaneous tissue: Secondary | ICD-10-CM

## 2023-08-27 DIAGNOSIS — M7989 Other specified soft tissue disorders: Secondary | ICD-10-CM

## 2023-08-27 MED ORDER — MEDIHONEY WOUND/BURN DRESSING EX PSTE: PASTE | CUTANEOUS | 5 refills | Status: AC

## 2023-08-27 NOTE — Progress Notes (Signed)
   Patient ID: Stacy [name], female   DOB: [birth year], 68 y.o.   MRN: [id number]  Chief Complaint  Patient presents with   Routine Post Op    Follow up TKA on 07/03/2023 of the left knee.. patient has open wound    We are treating this Rubey for open area distal portion of surgical incision with honey and Vibramycin 100 mg twice a day  See media for pictures of the wound  She will continue with this treatment for now and come back in 2 weeks for recheck  Meds ordered this encounter  Medications   leptospermum manuka honey (MEDIHONEY) PSTE paste    Sig: Apply daily    Dispense:  100 mL    Refill:  5    Quantity: same as before

## 2023-08-27 NOTE — Progress Notes (Signed)
   There were no vitals taken for this visit.  There is no height or weight on file to calculate BMI.  Chief Complaint  Patient presents with   Routine Post Op    Follow up TKA on 07/03/2023 of the left knee.. patient has open wound     Encounter Diagnosis  Name Primary?   S/P total knee replacement, left1/7/25 Yes    DOI/DOS/ Date: 06/27/2023  Improved

## 2023-09-10 ENCOUNTER — Ambulatory Visit (INDEPENDENT_AMBULATORY_CARE_PROVIDER_SITE_OTHER): Admitting: Orthopedic Surgery

## 2023-09-10 DIAGNOSIS — L7682 Other postprocedural complications of skin and subcutaneous tissue: Secondary | ICD-10-CM

## 2023-09-10 DIAGNOSIS — Z96652 Presence of left artificial knee joint: Secondary | ICD-10-CM

## 2023-09-10 NOTE — Progress Notes (Signed)
 Chief Complaint  Patient presents with   Follow-up    Recheck on left knee, DOS 07-03-23.   Encounter Diagnoses  Name Primary?   S/P total knee replacement, left1/7/25 Yes   Postoperative complication of skin involving drainage from surgical wound    2 months out from left total knee doing well except for inferior wound treated with honey and doxycycline doing well  Continue honey dressing follow-up in about 4 weeks

## 2023-10-08 ENCOUNTER — Encounter: Payer: Self-pay | Admitting: Orthopedic Surgery

## 2023-10-08 ENCOUNTER — Ambulatory Visit: Admitting: Orthopedic Surgery

## 2023-10-08 DIAGNOSIS — Z96652 Presence of left artificial knee joint: Secondary | ICD-10-CM

## 2023-10-08 DIAGNOSIS — G8929 Other chronic pain: Secondary | ICD-10-CM

## 2023-10-08 DIAGNOSIS — M545 Low back pain, unspecified: Secondary | ICD-10-CM | POA: Diagnosis not present

## 2023-10-08 DIAGNOSIS — L7682 Other postprocedural complications of skin and subcutaneous tissue: Secondary | ICD-10-CM

## 2023-10-08 MED ORDER — TIZANIDINE HCL 4 MG PO TABS: 4.0000 mg | ORAL_TABLET | Freq: Every day | ORAL | 1 refills | Status: AC

## 2023-10-08 MED ORDER — GABAPENTIN 100 MG PO CAPS
100.0000 mg | ORAL_CAPSULE | Freq: Three times a day (TID) | ORAL | 2 refills | Status: AC
Start: 2023-10-08 — End: ?

## 2023-10-08 NOTE — Progress Notes (Signed)
   There were no vitals taken for this visit.  There is no height or weight on file to calculate BMI.  Chief Complaint  Patient presents with   Dressing Change    Wound is continuing to close s/p LTKR 07/03/23    Back Pain    Continues to have back pain states was bothering her at last visit also and has not improved . She did get xrays ordered by her primary care.     No diagnosis found.  DOI/DOS/ Date: 07/03/23   Improved

## 2023-10-08 NOTE — Progress Notes (Signed)
   There were no vitals taken for this visit.  There is no height or weight on file to calculate BMI.  Chief Complaint  Patient presents with   Dressing Change    Wound is continuing to close s/p LTKR 07/03/23    Back Pain    Continues to have back pain states was bothering her at last visit also and has not improved . She did get xrays ordered by her primary care.     Encounter Diagnoses  Name Primary?   Postoperative complication of skin involving drainage from surgical wound Yes   S/P total knee replacement, left1/7/25    Chronic low back pain, unspecified back pain laterality, unspecified whether sciatica present     DOI/DOS/ Date: 07/03/23   Improved  Status post knee arthroplasty on the left developed a wound she is treating that with wound care and things look good there are though not completely resolved  She is also having persistent back pain.  She is on tramadol.  I recommended therapy she says she cannot go because of transportation issues so I put her on some tizanidine and gabapentin  Meds ordered this encounter  Medications   tiZANidine (ZANAFLEX) 4 MG tablet    Sig: Take 1 tablet (4 mg total) by mouth daily.    Dispense:  30 tablet    Refill:  1   gabapentin (NEURONTIN) 100 MG capsule    Sig: Take 1 capsule (100 mg total) by mouth 3 (three) times daily.    Dispense:  90 capsule    Refill:  2

## 2023-10-08 NOTE — Addendum Note (Signed)
 Addended by: Elsa Halls E on: 10/08/2023 03:09 PM   Modules accepted: Level of Service

## 2023-10-10 ENCOUNTER — Other Ambulatory Visit: Payer: Self-pay | Admitting: Orthopedic Surgery

## 2023-10-10 ENCOUNTER — Telehealth: Payer: Self-pay | Admitting: Orthopedic Surgery

## 2023-10-10 MED ORDER — METHOCARBAMOL 750 MG PO TABS: 750.0000 mg | ORAL_TABLET | Freq: Four times a day (QID) | ORAL | 2 refills | Status: DC

## 2023-10-10 NOTE — Telephone Encounter (Signed)
 Dr. Delfino Fellers pt - spoke w/the pt she stated that the Tizanidine 4mg  is making her dizzy, to the point she can't get out of the chair.  She would like something else called in to [facility].

## 2023-11-05 ENCOUNTER — Ambulatory Visit: Admitting: Orthopedic Surgery

## 2023-11-05 DIAGNOSIS — Z96652 Presence of left artificial knee joint: Secondary | ICD-10-CM

## 2023-11-05 DIAGNOSIS — L7682 Other postprocedural complications of skin and subcutaneous tissue: Secondary | ICD-10-CM

## 2023-11-05 NOTE — Progress Notes (Signed)
   There were no vitals taken for this visit.  There is no height or weight on file to calculate BMI.  Chief Complaint  Patient presents with   Knee Pain    Left TKA DOS 07/03/23     Encounter Diagnosis  Name Primary?   S/P total knee replacement, left1/7/25 Yes    DOI/DOS/ Date: 07/03/23  Unchanged

## 2023-11-05 NOTE — Progress Notes (Signed)
 Encounter Diagnoses  Name Primary?   S/P total knee replacement, left1/7/25 Yes   Postoperative complication of skin involving drainage from surgical wound     Check surgical wound  The wound has healed nicely except for the skin she has granulated in well from about a month and there are no signs of infection  Return in January for x-rays both total knees  (See media section for image of wound)

## 2023-12-22 ENCOUNTER — Other Ambulatory Visit: Payer: Self-pay | Admitting: Orthopedic Surgery

## 2024-03-19 ENCOUNTER — Other Ambulatory Visit: Payer: Self-pay | Admitting: Orthopedic Surgery

## 2024-06-05 ENCOUNTER — Other Ambulatory Visit: Payer: Self-pay | Admitting: Orthopedic Surgery

## 2024-08-06 ENCOUNTER — Ambulatory Visit: Admitting: Orthopedic Surgery

## 2024-08-06 DIAGNOSIS — Z96652 Presence of left artificial knee joint: Secondary | ICD-10-CM

## 2024-08-06 DIAGNOSIS — Z96651 Presence of right artificial knee joint: Secondary | ICD-10-CM

## 2024-08-07 ENCOUNTER — Ambulatory Visit: Admitting: Orthopedic Surgery
